# Patient Record
Sex: Male | Born: 1940 | Race: Black or African American | Hispanic: No | Marital: Married | Smoking: Never smoker
Health system: Southern US, Community
[De-identification: ages and names within clinical notes are randomized; demographics above are authoritative.]

## PROBLEM LIST (undated history)

## (undated) DIAGNOSIS — E119 Type 2 diabetes mellitus without complications: Secondary | ICD-10-CM

## (undated) DIAGNOSIS — I10 Essential (primary) hypertension: Secondary | ICD-10-CM

## (undated) DIAGNOSIS — M1990 Unspecified osteoarthritis, unspecified site: Secondary | ICD-10-CM

## (undated) HISTORY — DX: Unspecified osteoarthritis, unspecified site: M19.90

## (undated) HISTORY — PX: HERNIA REPAIR: SHX51

## (undated) HISTORY — PX: KNEE SURGERY: SHX244

## (undated) HISTORY — DX: Type 2 diabetes mellitus without complications: E11.9

## (undated) HISTORY — DX: Essential (primary) hypertension: I10

---

## 2014-08-15 DIAGNOSIS — E1142 Type 2 diabetes mellitus with diabetic polyneuropathy: Secondary | ICD-10-CM | POA: Diagnosis not present

## 2014-08-15 DIAGNOSIS — I10 Essential (primary) hypertension: Secondary | ICD-10-CM | POA: Diagnosis not present

## 2014-08-15 DIAGNOSIS — M1990 Unspecified osteoarthritis, unspecified site: Secondary | ICD-10-CM | POA: Diagnosis not present

## 2014-08-15 DIAGNOSIS — M109 Gout, unspecified: Secondary | ICD-10-CM | POA: Diagnosis not present

## 2014-12-17 DIAGNOSIS — I10 Essential (primary) hypertension: Secondary | ICD-10-CM | POA: Diagnosis not present

## 2014-12-17 DIAGNOSIS — M109 Gout, unspecified: Secondary | ICD-10-CM | POA: Diagnosis not present

## 2014-12-17 DIAGNOSIS — G609 Hereditary and idiopathic neuropathy, unspecified: Secondary | ICD-10-CM | POA: Diagnosis not present

## 2014-12-17 DIAGNOSIS — M1990 Unspecified osteoarthritis, unspecified site: Secondary | ICD-10-CM | POA: Diagnosis not present

## 2014-12-17 DIAGNOSIS — E1165 Type 2 diabetes mellitus with hyperglycemia: Secondary | ICD-10-CM | POA: Diagnosis not present

## 2014-12-17 DIAGNOSIS — E1142 Type 2 diabetes mellitus with diabetic polyneuropathy: Secondary | ICD-10-CM | POA: Diagnosis not present

## 2014-12-17 DIAGNOSIS — Z23 Encounter for immunization: Secondary | ICD-10-CM | POA: Diagnosis not present

## 2015-01-01 DIAGNOSIS — M25522 Pain in left elbow: Secondary | ICD-10-CM | POA: Diagnosis not present

## 2015-01-01 DIAGNOSIS — M25422 Effusion, left elbow: Secondary | ICD-10-CM | POA: Diagnosis not present

## 2015-06-30 DIAGNOSIS — E11319 Type 2 diabetes mellitus with unspecified diabetic retinopathy without macular edema: Secondary | ICD-10-CM | POA: Diagnosis not present

## 2015-06-30 DIAGNOSIS — H5210 Myopia, unspecified eye: Secondary | ICD-10-CM | POA: Diagnosis not present

## 2015-06-30 DIAGNOSIS — H524 Presbyopia: Secondary | ICD-10-CM | POA: Diagnosis not present

## 2015-07-10 DIAGNOSIS — M109 Gout, unspecified: Secondary | ICD-10-CM | POA: Diagnosis not present

## 2015-07-10 DIAGNOSIS — G609 Hereditary and idiopathic neuropathy, unspecified: Secondary | ICD-10-CM | POA: Diagnosis not present

## 2015-07-10 DIAGNOSIS — E1142 Type 2 diabetes mellitus with diabetic polyneuropathy: Secondary | ICD-10-CM | POA: Diagnosis not present

## 2015-07-10 DIAGNOSIS — M170 Bilateral primary osteoarthritis of knee: Secondary | ICD-10-CM | POA: Diagnosis not present

## 2015-07-10 DIAGNOSIS — M1990 Unspecified osteoarthritis, unspecified site: Secondary | ICD-10-CM | POA: Diagnosis not present

## 2015-07-10 DIAGNOSIS — E1165 Type 2 diabetes mellitus with hyperglycemia: Secondary | ICD-10-CM | POA: Diagnosis not present

## 2015-07-10 DIAGNOSIS — I10 Essential (primary) hypertension: Secondary | ICD-10-CM | POA: Diagnosis not present

## 2015-07-11 DIAGNOSIS — E1165 Type 2 diabetes mellitus with hyperglycemia: Secondary | ICD-10-CM | POA: Diagnosis not present

## 2015-07-29 DIAGNOSIS — M179 Osteoarthritis of knee, unspecified: Secondary | ICD-10-CM | POA: Diagnosis not present

## 2015-08-15 DIAGNOSIS — I200 Unstable angina: Secondary | ICD-10-CM | POA: Diagnosis not present

## 2015-08-15 DIAGNOSIS — G20 Parkinson's disease: Secondary | ICD-10-CM | POA: Diagnosis not present

## 2015-08-15 DIAGNOSIS — R0789 Other chest pain: Secondary | ICD-10-CM | POA: Diagnosis not present

## 2015-08-15 DIAGNOSIS — Z794 Long term (current) use of insulin: Secondary | ICD-10-CM | POA: Diagnosis not present

## 2015-08-15 DIAGNOSIS — R258 Other abnormal involuntary movements: Secondary | ICD-10-CM | POA: Diagnosis not present

## 2015-08-15 DIAGNOSIS — Z7984 Long term (current) use of oral hypoglycemic drugs: Secondary | ICD-10-CM | POA: Diagnosis not present

## 2015-08-15 DIAGNOSIS — R079 Chest pain, unspecified: Secondary | ICD-10-CM | POA: Diagnosis not present

## 2015-08-15 DIAGNOSIS — E11649 Type 2 diabetes mellitus with hypoglycemia without coma: Secondary | ICD-10-CM | POA: Diagnosis not present

## 2015-08-15 DIAGNOSIS — I10 Essential (primary) hypertension: Secondary | ICD-10-CM | POA: Diagnosis not present

## 2015-08-16 DIAGNOSIS — I200 Unstable angina: Secondary | ICD-10-CM | POA: Diagnosis not present

## 2015-08-22 DIAGNOSIS — R000 Tachycardia, unspecified: Secondary | ICD-10-CM | POA: Diagnosis not present

## 2015-08-22 DIAGNOSIS — I491 Atrial premature depolarization: Secondary | ICD-10-CM | POA: Diagnosis not present

## 2015-08-22 DIAGNOSIS — I493 Ventricular premature depolarization: Secondary | ICD-10-CM | POA: Diagnosis not present

## 2015-08-22 DIAGNOSIS — R079 Chest pain, unspecified: Secondary | ICD-10-CM | POA: Diagnosis not present

## 2015-09-08 DIAGNOSIS — M1711 Unilateral primary osteoarthritis, right knee: Secondary | ICD-10-CM | POA: Diagnosis not present

## 2015-11-06 DIAGNOSIS — E1142 Type 2 diabetes mellitus with diabetic polyneuropathy: Secondary | ICD-10-CM | POA: Diagnosis not present

## 2015-11-06 DIAGNOSIS — I209 Angina pectoris, unspecified: Secondary | ICD-10-CM | POA: Diagnosis not present

## 2015-11-06 DIAGNOSIS — M1990 Unspecified osteoarthritis, unspecified site: Secondary | ICD-10-CM | POA: Diagnosis not present

## 2015-11-06 DIAGNOSIS — I10 Essential (primary) hypertension: Secondary | ICD-10-CM | POA: Diagnosis not present

## 2015-11-10 ENCOUNTER — Encounter: Payer: Self-pay | Admitting: *Deleted

## 2015-11-11 ENCOUNTER — Ambulatory Visit (INDEPENDENT_AMBULATORY_CARE_PROVIDER_SITE_OTHER): Payer: Commercial Managed Care - HMO | Admitting: Cardiovascular Disease

## 2015-11-11 ENCOUNTER — Encounter: Payer: Self-pay | Admitting: Cardiovascular Disease

## 2015-11-11 VITALS — BP 136/77 | HR 81 | Ht 73.0 in | Wt 191.0 lb

## 2015-11-11 DIAGNOSIS — I10 Essential (primary) hypertension: Secondary | ICD-10-CM | POA: Diagnosis not present

## 2015-11-11 DIAGNOSIS — Z87898 Personal history of other specified conditions: Secondary | ICD-10-CM

## 2015-11-11 DIAGNOSIS — R079 Chest pain, unspecified: Secondary | ICD-10-CM

## 2015-11-11 DIAGNOSIS — Z9289 Personal history of other medical treatment: Secondary | ICD-10-CM

## 2015-11-11 NOTE — Progress Notes (Signed)
Patient ID: Vincent Mccoy, male   DOB: 12-17-1940, 75 y.o.   MRN: [id number]       CARDIOLOGY CONSULT NOTE  Patient ID: Vincent Mccoy MRN: [id number] DOB/AGE: 12-17-1940 75 y.o.  Admit date: (Not on file) Primary Physician: Avon GullyFANTA,TESFAYE, MD Referring Physician:  ECG showed normal sinus rhythm with no ischemic ST segment or T-wave abnormalities, nor any arrhythmias. Reason for Consultation: chest pain  HPI: The patient is a 75 year old male with a history of hypertension and insulin-dependent diabetes mellitus who is referred for the evaluation of chest pain. He was reportedly hospitalized at [facility] for chest pain and ruled out for an acute coronary syndrome. I reviewed all relevant labs and studies and documentation pertaining to this hospitalization. He was hospitalized in April 2017. His symptoms were considered somewhat atypical for cardiac disease. There was some thought that it could've been esophageal. Troponins were normal. Mildly anemic hemoglobin 11.4. He underwent stress testing on 08/22/15 which was negative for ischemia. This appeared to be an exercise treadmill stress test. Chest x-ray showed no acute cardiopulmonary disease on 3/31.  The patient denies any symptoms of chest pain, palpitations, shortness of breath, lightheadedness, dizziness, leg swelling, orthopnea, PND, and syncope.  He walks on a treadmill and exercise on a recumbent bicycle 3 times per week without any physical limitations.    No Known Allergies  Current Outpatient Prescriptions  Medication Sig Dispense Refill  . allopurinol (ZYLOPRIM) 300 MG tablet Take 1 tablet by mouth daily.    Marland Kitchen. amLODipine (NORVASC) 10 MG tablet Take 10 mg by mouth daily.    Marland Kitchen. aspirin EC 81 MG tablet Take 81 mg by mouth daily.    Marland Kitchen. glipiZIDE (GLUCOTROL XL) 10 MG 24 hr tablet Take 10 mg by mouth daily with breakfast.    . Insulin Glargine (LANTUS SOLOSTAR) 100 UNIT/ML Solostar Pen Inject 40 Units into the skin daily at 10 pm.      . losartan-hydrochlorothiazide (HYZAAR) 100-12.5 MG tablet Take 1 tablet by mouth daily.    . traMADol (ULTRAM) 50 MG tablet Take 50 mg by mouth 2 (two) times daily.     No current facility-administered medications for this visit.    Past Medical History  Diagnosis Date  . Diabetes mellitus (HCC)   . Hypertension   . Osteoarthritis     Past Surgical History  Procedure Laterality Date  . Hernia repair    . Knee surgery      CARTILAGE REPAIR    Social History   Social History  . Marital Status: Married    Spouse Name: N/A  . Number of Children: N/A  . Years of Education: N/A   Occupational History  . Not on file.   Social History Main Topics  . Smoking status: Never Smoker   . Smokeless tobacco: Never Used  . Alcohol Use: Not on file  . Drug Use: Not on file  . Sexual Activity: Not on file   Other Topics Concern  . Not on file   Social History Narrative     No family history of premature CAD in 1st degree relatives.  Prior to Admission medications   Medication Sig Start Date End Date Taking? Authorizing Provider  allopurinol (ZYLOPRIM) 300 MG tablet Take 1 tablet by mouth daily. 09/25/15  Yes Historical Provider, MD  amLODipine (NORVASC) 10 MG tablet Take 10 mg by mouth daily.   Yes Historical Provider, MD  aspirin EC 81 MG tablet Take 81 mg by mouth daily.  Yes Historical Provider, MD  glipiZIDE (GLUCOTROL XL) 10 MG 24 hr tablet Take 10 mg by mouth daily with breakfast.   Yes Historical Provider, MD  Insulin Glargine (LANTUS SOLOSTAR) 100 UNIT/ML Solostar Pen Inject 40 Units into the skin daily at 10 pm.    Yes Historical Provider, MD  losartan-hydrochlorothiazide (HYZAAR) 100-12.5 MG tablet Take 1 tablet by mouth daily.   Yes Historical Provider, MD  traMADol (ULTRAM) 50 MG tablet Take 50 mg by mouth 2 (two) times daily.   Yes Historical Provider, MD     Review of systems complete and found to be negative unless listed above in HPI     Physical  exam Blood pressure 136/77, pulse 81, height 6\' 1"  (1.854 m), weight 191 lb (86.637 kg). General: NAD Neck: No JVD, no thyromegaly or thyroid nodule.  Lungs: Clear to auscultation bilaterally with normal respiratory effort. CV: Nondisplaced PMI. Regular rate and rhythm, normal S1/S2, no S3/S4, no murmur.  No peripheral edema.  No carotid bruit.    Abdomen: Soft, nontender, no distention.  Skin: Intact without lesions or rashes.  Neurologic: Alert and oriented x 3.  Psych: Normal affect. Extremities: No clubbing or cyanosis.  HEENT: Normal.   ECG: Most recent ECG reviewed.  Labs:  No results found for: WBC, HGB, HCT, MCV, PLT No results for input(s): NA, K, CL, CO2, BUN, CREATININE, CALCIUM, PROT, BILITOT, ALKPHOS, ALT, AST, GLUCOSE in the last 168 hours.  Invalid input(s): LABALBU No results found for: CKTOTAL, CKMB, CKMBINDEX, TROPONINI No results found for: CHOL No results found for: HDL No results found for: LDLCALC No results found for: TRIG No results found for: CHOLHDL No results found for: LDLDIRECT       Studies: No results found.  ASSESSMENT AND PLAN:  1. Chest pain: No recurrences. Normal GXT. Exercises routinely without symptoms. No further testing indicated.  2. Essential HTN: Controlled. No changes.  Dispo: fu prn.   Signed: Prentice DockerSuresh Avonelle Viveros, M.D., F.A.C.C.  11/11/2015, 9:35 AM

## 2015-11-11 NOTE — Patient Instructions (Signed)
Your physician recommends that you schedule a follow-up appointment AS NEEDED WITH DR. KONESWARAN  Your physician recommends that you continue on your current medications as directed. Please refer to the Current Medication list given to you today.  Thank you for choosing [facility]!!   

## 2016-02-13 DIAGNOSIS — E1165 Type 2 diabetes mellitus with hyperglycemia: Secondary | ICD-10-CM | POA: Diagnosis not present

## 2016-02-13 DIAGNOSIS — M109 Gout, unspecified: Secondary | ICD-10-CM | POA: Diagnosis not present

## 2016-02-13 DIAGNOSIS — E1142 Type 2 diabetes mellitus with diabetic polyneuropathy: Secondary | ICD-10-CM | POA: Diagnosis not present

## 2016-02-13 DIAGNOSIS — Z0000 Encounter for general adult medical examination without abnormal findings: Secondary | ICD-10-CM | POA: Diagnosis not present

## 2016-02-13 DIAGNOSIS — M1990 Unspecified osteoarthritis, unspecified site: Secondary | ICD-10-CM | POA: Diagnosis not present

## 2016-02-13 DIAGNOSIS — G609 Hereditary and idiopathic neuropathy, unspecified: Secondary | ICD-10-CM | POA: Diagnosis not present

## 2016-02-13 DIAGNOSIS — I10 Essential (primary) hypertension: Secondary | ICD-10-CM | POA: Diagnosis not present

## 2016-02-17 DIAGNOSIS — E119 Type 2 diabetes mellitus without complications: Secondary | ICD-10-CM | POA: Diagnosis not present

## 2016-02-23 DIAGNOSIS — G20 Parkinson's disease: Secondary | ICD-10-CM | POA: Diagnosis not present

## 2016-02-23 DIAGNOSIS — I10 Essential (primary) hypertension: Secondary | ICD-10-CM | POA: Diagnosis not present

## 2016-02-23 DIAGNOSIS — Z79899 Other long term (current) drug therapy: Secondary | ICD-10-CM | POA: Diagnosis not present

## 2016-02-23 DIAGNOSIS — Z7982 Long term (current) use of aspirin: Secondary | ICD-10-CM | POA: Diagnosis not present

## 2016-02-23 DIAGNOSIS — M76892 Other specified enthesopathies of left lower limb, excluding foot: Secondary | ICD-10-CM | POA: Diagnosis not present

## 2016-02-23 DIAGNOSIS — Z794 Long term (current) use of insulin: Secondary | ICD-10-CM | POA: Diagnosis not present

## 2016-02-23 DIAGNOSIS — E119 Type 2 diabetes mellitus without complications: Secondary | ICD-10-CM | POA: Diagnosis not present

## 2016-02-23 DIAGNOSIS — M79662 Pain in left lower leg: Secondary | ICD-10-CM | POA: Diagnosis not present

## 2016-03-04 DIAGNOSIS — M79605 Pain in left leg: Secondary | ICD-10-CM | POA: Diagnosis not present

## 2016-03-26 DIAGNOSIS — M545 Low back pain: Secondary | ICD-10-CM | POA: Diagnosis not present

## 2016-03-26 DIAGNOSIS — M5432 Sciatica, left side: Secondary | ICD-10-CM | POA: Diagnosis not present

## 2016-04-06 DIAGNOSIS — M5432 Sciatica, left side: Secondary | ICD-10-CM | POA: Diagnosis not present

## 2016-04-06 DIAGNOSIS — R937 Abnormal findings on diagnostic imaging of other parts of musculoskeletal system: Secondary | ICD-10-CM | POA: Diagnosis not present

## 2016-04-06 DIAGNOSIS — M48061 Spinal stenosis, lumbar region without neurogenic claudication: Secondary | ICD-10-CM | POA: Diagnosis not present

## 2016-04-06 DIAGNOSIS — M545 Low back pain: Secondary | ICD-10-CM | POA: Diagnosis not present

## 2016-04-06 DIAGNOSIS — M9943 Connective tissue stenosis of neural canal of lumbar region: Secondary | ICD-10-CM | POA: Diagnosis not present

## 2016-04-06 DIAGNOSIS — M5126 Other intervertebral disc displacement, lumbar region: Secondary | ICD-10-CM | POA: Diagnosis not present

## 2016-04-12 DIAGNOSIS — M5116 Intervertebral disc disorders with radiculopathy, lumbar region: Secondary | ICD-10-CM | POA: Diagnosis not present

## 2016-04-12 DIAGNOSIS — M5136 Other intervertebral disc degeneration, lumbar region: Secondary | ICD-10-CM | POA: Diagnosis not present

## 2016-04-12 DIAGNOSIS — M48061 Spinal stenosis, lumbar region without neurogenic claudication: Secondary | ICD-10-CM | POA: Diagnosis not present

## 2016-05-12 DIAGNOSIS — I10 Essential (primary) hypertension: Secondary | ICD-10-CM | POA: Diagnosis not present

## 2016-05-27 DIAGNOSIS — Z79899 Other long term (current) drug therapy: Secondary | ICD-10-CM | POA: Diagnosis not present

## 2016-05-27 DIAGNOSIS — I10 Essential (primary) hypertension: Secondary | ICD-10-CM | POA: Diagnosis not present

## 2016-05-27 DIAGNOSIS — Z794 Long term (current) use of insulin: Secondary | ICD-10-CM | POA: Diagnosis not present

## 2016-05-27 DIAGNOSIS — M810 Age-related osteoporosis without current pathological fracture: Secondary | ICD-10-CM | POA: Diagnosis not present

## 2016-05-27 DIAGNOSIS — E119 Type 2 diabetes mellitus without complications: Secondary | ICD-10-CM | POA: Diagnosis not present

## 2016-05-27 DIAGNOSIS — M109 Gout, unspecified: Secondary | ICD-10-CM | POA: Diagnosis not present

## 2016-05-27 DIAGNOSIS — Z7982 Long term (current) use of aspirin: Secondary | ICD-10-CM | POA: Diagnosis not present

## 2016-06-03 DIAGNOSIS — M48061 Spinal stenosis, lumbar region without neurogenic claudication: Secondary | ICD-10-CM | POA: Diagnosis not present

## 2016-06-03 DIAGNOSIS — M5126 Other intervertebral disc displacement, lumbar region: Secondary | ICD-10-CM | POA: Diagnosis not present

## 2016-06-21 DIAGNOSIS — Z538 Procedure and treatment not carried out for other reasons: Secondary | ICD-10-CM | POA: Diagnosis not present

## 2016-06-21 DIAGNOSIS — R7989 Other specified abnormal findings of blood chemistry: Secondary | ICD-10-CM | POA: Diagnosis not present

## 2016-06-21 DIAGNOSIS — M5126 Other intervertebral disc displacement, lumbar region: Secondary | ICD-10-CM | POA: Diagnosis not present

## 2016-06-24 DIAGNOSIS — R7989 Other specified abnormal findings of blood chemistry: Secondary | ICD-10-CM | POA: Diagnosis not present

## 2016-06-24 DIAGNOSIS — Z538 Procedure and treatment not carried out for other reasons: Secondary | ICD-10-CM | POA: Diagnosis not present

## 2016-06-24 DIAGNOSIS — M5126 Other intervertebral disc displacement, lumbar region: Secondary | ICD-10-CM | POA: Diagnosis not present

## 2016-06-28 DIAGNOSIS — E119 Type 2 diabetes mellitus without complications: Secondary | ICD-10-CM | POA: Diagnosis not present

## 2016-06-28 DIAGNOSIS — M5106 Intervertebral disc disorders with myelopathy, lumbar region: Secondary | ICD-10-CM | POA: Diagnosis not present

## 2016-06-28 DIAGNOSIS — M5126 Other intervertebral disc displacement, lumbar region: Secondary | ICD-10-CM | POA: Diagnosis not present

## 2016-06-28 DIAGNOSIS — I10 Essential (primary) hypertension: Secondary | ICD-10-CM | POA: Diagnosis not present

## 2016-06-28 DIAGNOSIS — Z981 Arthrodesis status: Secondary | ICD-10-CM | POA: Diagnosis not present

## 2016-06-29 DIAGNOSIS — M5126 Other intervertebral disc displacement, lumbar region: Secondary | ICD-10-CM | POA: Diagnosis not present

## 2016-06-30 DIAGNOSIS — M5126 Other intervertebral disc displacement, lumbar region: Secondary | ICD-10-CM | POA: Diagnosis not present

## 2016-07-01 DIAGNOSIS — I10 Essential (primary) hypertension: Secondary | ICD-10-CM | POA: Diagnosis not present

## 2016-07-01 DIAGNOSIS — Z4789 Encounter for other orthopedic aftercare: Secondary | ICD-10-CM | POA: Diagnosis not present

## 2016-07-01 DIAGNOSIS — M4326 Fusion of spine, lumbar region: Secondary | ICD-10-CM | POA: Diagnosis not present

## 2016-07-01 DIAGNOSIS — M6281 Muscle weakness (generalized): Secondary | ICD-10-CM | POA: Diagnosis not present

## 2016-07-01 DIAGNOSIS — Z7982 Long term (current) use of aspirin: Secondary | ICD-10-CM | POA: Diagnosis not present

## 2016-07-01 DIAGNOSIS — E119 Type 2 diabetes mellitus without complications: Secondary | ICD-10-CM | POA: Diagnosis not present

## 2016-07-01 DIAGNOSIS — Z794 Long term (current) use of insulin: Secondary | ICD-10-CM | POA: Diagnosis not present

## 2016-07-02 DIAGNOSIS — M5126 Other intervertebral disc displacement, lumbar region: Secondary | ICD-10-CM | POA: Diagnosis not present

## 2016-07-02 DIAGNOSIS — M48061 Spinal stenosis, lumbar region without neurogenic claudication: Secondary | ICD-10-CM | POA: Diagnosis not present

## 2016-07-07 DIAGNOSIS — M6281 Muscle weakness (generalized): Secondary | ICD-10-CM | POA: Diagnosis not present

## 2016-07-07 DIAGNOSIS — Z4789 Encounter for other orthopedic aftercare: Secondary | ICD-10-CM | POA: Diagnosis not present

## 2016-07-07 DIAGNOSIS — Z7982 Long term (current) use of aspirin: Secondary | ICD-10-CM | POA: Diagnosis not present

## 2016-07-07 DIAGNOSIS — E119 Type 2 diabetes mellitus without complications: Secondary | ICD-10-CM | POA: Diagnosis not present

## 2016-07-07 DIAGNOSIS — M4326 Fusion of spine, lumbar region: Secondary | ICD-10-CM | POA: Diagnosis not present

## 2016-07-07 DIAGNOSIS — I10 Essential (primary) hypertension: Secondary | ICD-10-CM | POA: Diagnosis not present

## 2016-07-07 DIAGNOSIS — Z794 Long term (current) use of insulin: Secondary | ICD-10-CM | POA: Diagnosis not present

## 2016-07-09 DIAGNOSIS — M4326 Fusion of spine, lumbar region: Secondary | ICD-10-CM | POA: Diagnosis not present

## 2016-07-09 DIAGNOSIS — I10 Essential (primary) hypertension: Secondary | ICD-10-CM | POA: Diagnosis not present

## 2016-07-09 DIAGNOSIS — Z7982 Long term (current) use of aspirin: Secondary | ICD-10-CM | POA: Diagnosis not present

## 2016-07-09 DIAGNOSIS — Z4789 Encounter for other orthopedic aftercare: Secondary | ICD-10-CM | POA: Diagnosis not present

## 2016-07-09 DIAGNOSIS — Z794 Long term (current) use of insulin: Secondary | ICD-10-CM | POA: Diagnosis not present

## 2016-07-09 DIAGNOSIS — M6281 Muscle weakness (generalized): Secondary | ICD-10-CM | POA: Diagnosis not present

## 2016-07-09 DIAGNOSIS — E119 Type 2 diabetes mellitus without complications: Secondary | ICD-10-CM | POA: Diagnosis not present

## 2016-07-15 DIAGNOSIS — E119 Type 2 diabetes mellitus without complications: Secondary | ICD-10-CM | POA: Diagnosis not present

## 2016-07-15 DIAGNOSIS — M6281 Muscle weakness (generalized): Secondary | ICD-10-CM | POA: Diagnosis not present

## 2016-07-15 DIAGNOSIS — Z7982 Long term (current) use of aspirin: Secondary | ICD-10-CM | POA: Diagnosis not present

## 2016-07-15 DIAGNOSIS — M4326 Fusion of spine, lumbar region: Secondary | ICD-10-CM | POA: Diagnosis not present

## 2016-07-15 DIAGNOSIS — Z794 Long term (current) use of insulin: Secondary | ICD-10-CM | POA: Diagnosis not present

## 2016-07-15 DIAGNOSIS — I10 Essential (primary) hypertension: Secondary | ICD-10-CM | POA: Diagnosis not present

## 2016-07-15 DIAGNOSIS — Z4789 Encounter for other orthopedic aftercare: Secondary | ICD-10-CM | POA: Diagnosis not present

## 2016-07-16 DIAGNOSIS — E119 Type 2 diabetes mellitus without complications: Secondary | ICD-10-CM | POA: Diagnosis not present

## 2016-07-16 DIAGNOSIS — I10 Essential (primary) hypertension: Secondary | ICD-10-CM | POA: Diagnosis not present

## 2016-07-16 DIAGNOSIS — M4326 Fusion of spine, lumbar region: Secondary | ICD-10-CM | POA: Diagnosis not present

## 2016-07-16 DIAGNOSIS — M6281 Muscle weakness (generalized): Secondary | ICD-10-CM | POA: Diagnosis not present

## 2016-07-16 DIAGNOSIS — Z7982 Long term (current) use of aspirin: Secondary | ICD-10-CM | POA: Diagnosis not present

## 2016-07-16 DIAGNOSIS — Z4789 Encounter for other orthopedic aftercare: Secondary | ICD-10-CM | POA: Diagnosis not present

## 2016-07-16 DIAGNOSIS — Z794 Long term (current) use of insulin: Secondary | ICD-10-CM | POA: Diagnosis not present

## 2016-07-19 DIAGNOSIS — M1990 Unspecified osteoarthritis, unspecified site: Secondary | ICD-10-CM | POA: Diagnosis not present

## 2016-07-19 DIAGNOSIS — M109 Gout, unspecified: Secondary | ICD-10-CM | POA: Diagnosis not present

## 2016-07-19 DIAGNOSIS — M961 Postlaminectomy syndrome, not elsewhere classified: Secondary | ICD-10-CM | POA: Diagnosis not present

## 2016-07-19 DIAGNOSIS — E119 Type 2 diabetes mellitus without complications: Secondary | ICD-10-CM | POA: Diagnosis not present

## 2016-07-19 DIAGNOSIS — E1142 Type 2 diabetes mellitus with diabetic polyneuropathy: Secondary | ICD-10-CM | POA: Diagnosis not present

## 2016-07-19 DIAGNOSIS — E1165 Type 2 diabetes mellitus with hyperglycemia: Secondary | ICD-10-CM | POA: Diagnosis not present

## 2016-07-19 DIAGNOSIS — G609 Hereditary and idiopathic neuropathy, unspecified: Secondary | ICD-10-CM | POA: Diagnosis not present

## 2016-07-19 DIAGNOSIS — I10 Essential (primary) hypertension: Secondary | ICD-10-CM | POA: Diagnosis not present

## 2016-07-19 DIAGNOSIS — Z0000 Encounter for general adult medical examination without abnormal findings: Secondary | ICD-10-CM | POA: Diagnosis not present

## 2016-07-21 DIAGNOSIS — Z4789 Encounter for other orthopedic aftercare: Secondary | ICD-10-CM | POA: Diagnosis not present

## 2016-07-21 DIAGNOSIS — E119 Type 2 diabetes mellitus without complications: Secondary | ICD-10-CM | POA: Diagnosis not present

## 2016-07-21 DIAGNOSIS — M4326 Fusion of spine, lumbar region: Secondary | ICD-10-CM | POA: Diagnosis not present

## 2016-07-21 DIAGNOSIS — M6281 Muscle weakness (generalized): Secondary | ICD-10-CM | POA: Diagnosis not present

## 2016-07-21 DIAGNOSIS — Z794 Long term (current) use of insulin: Secondary | ICD-10-CM | POA: Diagnosis not present

## 2016-07-21 DIAGNOSIS — Z7982 Long term (current) use of aspirin: Secondary | ICD-10-CM | POA: Diagnosis not present

## 2016-07-21 DIAGNOSIS — I10 Essential (primary) hypertension: Secondary | ICD-10-CM | POA: Diagnosis not present

## 2016-07-23 DIAGNOSIS — Z794 Long term (current) use of insulin: Secondary | ICD-10-CM | POA: Diagnosis not present

## 2016-07-23 DIAGNOSIS — E119 Type 2 diabetes mellitus without complications: Secondary | ICD-10-CM | POA: Diagnosis not present

## 2016-07-23 DIAGNOSIS — M4326 Fusion of spine, lumbar region: Secondary | ICD-10-CM | POA: Diagnosis not present

## 2016-07-23 DIAGNOSIS — I10 Essential (primary) hypertension: Secondary | ICD-10-CM | POA: Diagnosis not present

## 2016-07-23 DIAGNOSIS — M6281 Muscle weakness (generalized): Secondary | ICD-10-CM | POA: Diagnosis not present

## 2016-07-23 DIAGNOSIS — Z7982 Long term (current) use of aspirin: Secondary | ICD-10-CM | POA: Diagnosis not present

## 2016-07-23 DIAGNOSIS — Z4789 Encounter for other orthopedic aftercare: Secondary | ICD-10-CM | POA: Diagnosis not present

## 2016-07-27 DIAGNOSIS — Z7982 Long term (current) use of aspirin: Secondary | ICD-10-CM | POA: Diagnosis not present

## 2016-07-27 DIAGNOSIS — M4326 Fusion of spine, lumbar region: Secondary | ICD-10-CM | POA: Diagnosis not present

## 2016-07-27 DIAGNOSIS — M6281 Muscle weakness (generalized): Secondary | ICD-10-CM | POA: Diagnosis not present

## 2016-07-27 DIAGNOSIS — I10 Essential (primary) hypertension: Secondary | ICD-10-CM | POA: Diagnosis not present

## 2016-07-27 DIAGNOSIS — E119 Type 2 diabetes mellitus without complications: Secondary | ICD-10-CM | POA: Diagnosis not present

## 2016-07-27 DIAGNOSIS — Z794 Long term (current) use of insulin: Secondary | ICD-10-CM | POA: Diagnosis not present

## 2016-07-27 DIAGNOSIS — Z4789 Encounter for other orthopedic aftercare: Secondary | ICD-10-CM | POA: Diagnosis not present

## 2016-07-28 DIAGNOSIS — Z4789 Encounter for other orthopedic aftercare: Secondary | ICD-10-CM | POA: Diagnosis not present

## 2016-07-28 DIAGNOSIS — M6281 Muscle weakness (generalized): Secondary | ICD-10-CM | POA: Diagnosis not present

## 2016-07-28 DIAGNOSIS — E119 Type 2 diabetes mellitus without complications: Secondary | ICD-10-CM | POA: Diagnosis not present

## 2016-07-28 DIAGNOSIS — Z7982 Long term (current) use of aspirin: Secondary | ICD-10-CM | POA: Diagnosis not present

## 2016-07-28 DIAGNOSIS — Z794 Long term (current) use of insulin: Secondary | ICD-10-CM | POA: Diagnosis not present

## 2016-07-28 DIAGNOSIS — I10 Essential (primary) hypertension: Secondary | ICD-10-CM | POA: Diagnosis not present

## 2016-07-28 DIAGNOSIS — M4326 Fusion of spine, lumbar region: Secondary | ICD-10-CM | POA: Diagnosis not present

## 2016-08-03 DIAGNOSIS — M4326 Fusion of spine, lumbar region: Secondary | ICD-10-CM | POA: Diagnosis not present

## 2016-08-03 DIAGNOSIS — E119 Type 2 diabetes mellitus without complications: Secondary | ICD-10-CM | POA: Diagnosis not present

## 2016-08-03 DIAGNOSIS — Z794 Long term (current) use of insulin: Secondary | ICD-10-CM | POA: Diagnosis not present

## 2016-08-03 DIAGNOSIS — M6281 Muscle weakness (generalized): Secondary | ICD-10-CM | POA: Diagnosis not present

## 2016-08-03 DIAGNOSIS — I10 Essential (primary) hypertension: Secondary | ICD-10-CM | POA: Diagnosis not present

## 2016-08-03 DIAGNOSIS — Z4789 Encounter for other orthopedic aftercare: Secondary | ICD-10-CM | POA: Diagnosis not present

## 2016-08-03 DIAGNOSIS — Z7982 Long term (current) use of aspirin: Secondary | ICD-10-CM | POA: Diagnosis not present

## 2016-08-06 DIAGNOSIS — I10 Essential (primary) hypertension: Secondary | ICD-10-CM | POA: Diagnosis not present

## 2016-08-06 DIAGNOSIS — M6281 Muscle weakness (generalized): Secondary | ICD-10-CM | POA: Diagnosis not present

## 2016-08-06 DIAGNOSIS — Z794 Long term (current) use of insulin: Secondary | ICD-10-CM | POA: Diagnosis not present

## 2016-08-06 DIAGNOSIS — E119 Type 2 diabetes mellitus without complications: Secondary | ICD-10-CM | POA: Diagnosis not present

## 2016-08-06 DIAGNOSIS — Z4789 Encounter for other orthopedic aftercare: Secondary | ICD-10-CM | POA: Diagnosis not present

## 2016-08-06 DIAGNOSIS — M4326 Fusion of spine, lumbar region: Secondary | ICD-10-CM | POA: Diagnosis not present

## 2016-08-06 DIAGNOSIS — Z7982 Long term (current) use of aspirin: Secondary | ICD-10-CM | POA: Diagnosis not present

## 2016-08-18 ENCOUNTER — Telehealth: Payer: Self-pay

## 2016-08-18 NOTE — Telephone Encounter (Signed)
Noted  

## 2016-08-18 NOTE — Telephone Encounter (Signed)
Pt called- he said we have been trying to get in touch with him to schedule tcs. He said he spoke with his doctor and was told to wait on the tcs d/t his recent back surgery. Pt stated he would call us back when his doctor told him it was ok to have it done.   Routing to [employer] as FYI.

## 2016-08-30 DIAGNOSIS — M179 Osteoarthritis of knee, unspecified: Secondary | ICD-10-CM | POA: Diagnosis not present

## 2016-09-28 DIAGNOSIS — M5432 Sciatica, left side: Secondary | ICD-10-CM | POA: Diagnosis not present

## 2016-09-28 DIAGNOSIS — M25561 Pain in right knee: Secondary | ICD-10-CM | POA: Diagnosis not present

## 2016-09-28 DIAGNOSIS — M1711 Unilateral primary osteoarthritis, right knee: Secondary | ICD-10-CM | POA: Diagnosis not present

## 2016-10-04 DIAGNOSIS — M810 Age-related osteoporosis without current pathological fracture: Secondary | ICD-10-CM | POA: Diagnosis not present

## 2016-10-19 DIAGNOSIS — I10 Essential (primary) hypertension: Secondary | ICD-10-CM | POA: Diagnosis not present

## 2016-10-19 DIAGNOSIS — N3941 Urge incontinence: Secondary | ICD-10-CM | POA: Diagnosis not present

## 2016-10-19 DIAGNOSIS — E1165 Type 2 diabetes mellitus with hyperglycemia: Secondary | ICD-10-CM | POA: Diagnosis not present

## 2016-10-19 DIAGNOSIS — M1990 Unspecified osteoarthritis, unspecified site: Secondary | ICD-10-CM | POA: Diagnosis not present

## 2016-11-03 DIAGNOSIS — M5126 Other intervertebral disc displacement, lumbar region: Secondary | ICD-10-CM | POA: Diagnosis not present

## 2017-03-02 DIAGNOSIS — Z0000 Encounter for general adult medical examination without abnormal findings: Secondary | ICD-10-CM | POA: Diagnosis not present

## 2017-03-02 DIAGNOSIS — G609 Hereditary and idiopathic neuropathy, unspecified: Secondary | ICD-10-CM | POA: Diagnosis not present

## 2017-03-02 DIAGNOSIS — E119 Type 2 diabetes mellitus without complications: Secondary | ICD-10-CM | POA: Diagnosis not present

## 2017-03-02 DIAGNOSIS — M1000 Idiopathic gout, unspecified site: Secondary | ICD-10-CM | POA: Diagnosis not present

## 2017-03-02 DIAGNOSIS — E1165 Type 2 diabetes mellitus with hyperglycemia: Secondary | ICD-10-CM | POA: Diagnosis not present

## 2017-03-02 DIAGNOSIS — E1142 Type 2 diabetes mellitus with diabetic polyneuropathy: Secondary | ICD-10-CM | POA: Diagnosis not present

## 2017-03-02 DIAGNOSIS — M109 Gout, unspecified: Secondary | ICD-10-CM | POA: Diagnosis not present

## 2017-03-02 DIAGNOSIS — Z1389 Encounter for screening for other disorder: Secondary | ICD-10-CM | POA: Diagnosis not present

## 2017-03-02 DIAGNOSIS — I10 Essential (primary) hypertension: Secondary | ICD-10-CM | POA: Diagnosis not present

## 2017-03-02 DIAGNOSIS — M1990 Unspecified osteoarthritis, unspecified site: Secondary | ICD-10-CM | POA: Diagnosis not present

## 2017-03-03 DIAGNOSIS — M109 Gout, unspecified: Secondary | ICD-10-CM | POA: Diagnosis not present

## 2017-03-03 DIAGNOSIS — E1165 Type 2 diabetes mellitus with hyperglycemia: Secondary | ICD-10-CM | POA: Diagnosis not present

## 2017-03-03 DIAGNOSIS — G609 Hereditary and idiopathic neuropathy, unspecified: Secondary | ICD-10-CM | POA: Diagnosis not present

## 2017-03-03 DIAGNOSIS — Z0000 Encounter for general adult medical examination without abnormal findings: Secondary | ICD-10-CM | POA: Diagnosis not present

## 2017-03-03 DIAGNOSIS — E1142 Type 2 diabetes mellitus with diabetic polyneuropathy: Secondary | ICD-10-CM | POA: Diagnosis not present

## 2017-03-03 DIAGNOSIS — M1990 Unspecified osteoarthritis, unspecified site: Secondary | ICD-10-CM | POA: Diagnosis not present

## 2017-03-03 DIAGNOSIS — I10 Essential (primary) hypertension: Secondary | ICD-10-CM | POA: Diagnosis not present

## 2017-03-03 DIAGNOSIS — E119 Type 2 diabetes mellitus without complications: Secondary | ICD-10-CM | POA: Diagnosis not present

## 2017-04-03 DIAGNOSIS — E119 Type 2 diabetes mellitus without complications: Secondary | ICD-10-CM | POA: Diagnosis not present

## 2017-04-03 DIAGNOSIS — Z794 Long term (current) use of insulin: Secondary | ICD-10-CM | POA: Diagnosis not present

## 2017-04-03 DIAGNOSIS — Z7982 Long term (current) use of aspirin: Secondary | ICD-10-CM | POA: Diagnosis not present

## 2017-04-03 DIAGNOSIS — E118 Type 2 diabetes mellitus with unspecified complications: Secondary | ICD-10-CM | POA: Diagnosis not present

## 2017-04-03 DIAGNOSIS — R55 Syncope and collapse: Secondary | ICD-10-CM | POA: Diagnosis not present

## 2017-04-03 DIAGNOSIS — I10 Essential (primary) hypertension: Secondary | ICD-10-CM | POA: Diagnosis not present

## 2017-04-03 DIAGNOSIS — E860 Dehydration: Secondary | ICD-10-CM | POA: Diagnosis not present

## 2017-04-03 DIAGNOSIS — G20 Parkinson's disease: Secondary | ICD-10-CM | POA: Diagnosis not present

## 2017-04-03 DIAGNOSIS — Z8639 Personal history of other endocrine, nutritional and metabolic disease: Secondary | ICD-10-CM | POA: Diagnosis not present

## 2017-04-03 DIAGNOSIS — M1990 Unspecified osteoarthritis, unspecified site: Secondary | ICD-10-CM | POA: Diagnosis not present

## 2017-04-03 DIAGNOSIS — R109 Unspecified abdominal pain: Secondary | ICD-10-CM | POA: Diagnosis not present

## 2017-04-04 DIAGNOSIS — E1165 Type 2 diabetes mellitus with hyperglycemia: Secondary | ICD-10-CM | POA: Diagnosis not present

## 2017-04-04 DIAGNOSIS — I10 Essential (primary) hypertension: Secondary | ICD-10-CM | POA: Diagnosis not present

## 2017-06-02 DIAGNOSIS — M1990 Unspecified osteoarthritis, unspecified site: Secondary | ICD-10-CM | POA: Diagnosis not present

## 2017-06-02 DIAGNOSIS — M1000 Idiopathic gout, unspecified site: Secondary | ICD-10-CM | POA: Diagnosis not present

## 2017-06-02 DIAGNOSIS — I10 Essential (primary) hypertension: Secondary | ICD-10-CM | POA: Diagnosis not present

## 2017-06-02 DIAGNOSIS — E1165 Type 2 diabetes mellitus with hyperglycemia: Secondary | ICD-10-CM | POA: Diagnosis not present

## 2017-06-16 DIAGNOSIS — M1711 Unilateral primary osteoarthritis, right knee: Secondary | ICD-10-CM | POA: Diagnosis not present

## 2017-06-16 DIAGNOSIS — M25561 Pain in right knee: Secondary | ICD-10-CM | POA: Insufficient documentation

## 2017-06-16 DIAGNOSIS — S72341P Displaced spiral fracture of shaft of right femur, subsequent encounter for closed fracture with malunion: Secondary | ICD-10-CM | POA: Diagnosis not present

## 2017-07-19 ENCOUNTER — Ambulatory Visit: Payer: Medicare HMO | Admitting: Urology

## 2017-07-19 DIAGNOSIS — R3915 Urgency of urination: Secondary | ICD-10-CM

## 2017-07-19 DIAGNOSIS — R351 Nocturia: Secondary | ICD-10-CM | POA: Diagnosis not present

## 2017-07-19 DIAGNOSIS — R350 Frequency of micturition: Secondary | ICD-10-CM | POA: Diagnosis not present

## 2017-07-19 DIAGNOSIS — Q539 Undescended testicle, unspecified: Secondary | ICD-10-CM

## 2017-09-01 DIAGNOSIS — E1165 Type 2 diabetes mellitus with hyperglycemia: Secondary | ICD-10-CM | POA: Diagnosis not present

## 2017-09-01 DIAGNOSIS — I10 Essential (primary) hypertension: Secondary | ICD-10-CM | POA: Diagnosis not present

## 2017-09-01 DIAGNOSIS — E119 Type 2 diabetes mellitus without complications: Secondary | ICD-10-CM | POA: Diagnosis not present

## 2017-09-01 DIAGNOSIS — J449 Chronic obstructive pulmonary disease, unspecified: Secondary | ICD-10-CM | POA: Diagnosis not present

## 2017-09-01 DIAGNOSIS — E785 Hyperlipidemia, unspecified: Secondary | ICD-10-CM | POA: Diagnosis not present

## 2017-09-01 DIAGNOSIS — R739 Hyperglycemia, unspecified: Secondary | ICD-10-CM | POA: Diagnosis not present

## 2017-09-01 DIAGNOSIS — E039 Hypothyroidism, unspecified: Secondary | ICD-10-CM | POA: Diagnosis not present

## 2017-09-01 DIAGNOSIS — M1000 Idiopathic gout, unspecified site: Secondary | ICD-10-CM | POA: Diagnosis not present

## 2017-09-01 DIAGNOSIS — E669 Obesity, unspecified: Secondary | ICD-10-CM | POA: Diagnosis not present

## 2017-11-11 DIAGNOSIS — H5213 Myopia, bilateral: Secondary | ICD-10-CM | POA: Diagnosis not present

## 2017-11-29 DIAGNOSIS — M1711 Unilateral primary osteoarthritis, right knee: Secondary | ICD-10-CM | POA: Diagnosis not present

## 2017-12-22 DIAGNOSIS — E1165 Type 2 diabetes mellitus with hyperglycemia: Secondary | ICD-10-CM | POA: Diagnosis not present

## 2017-12-22 DIAGNOSIS — E7800 Pure hypercholesterolemia, unspecified: Secondary | ICD-10-CM | POA: Diagnosis not present

## 2017-12-22 DIAGNOSIS — Z6827 Body mass index (BMI) 27.0-27.9, adult: Secondary | ICD-10-CM | POA: Diagnosis not present

## 2017-12-22 DIAGNOSIS — Z789 Other specified health status: Secondary | ICD-10-CM | POA: Diagnosis not present

## 2017-12-22 DIAGNOSIS — N400 Enlarged prostate without lower urinary tract symptoms: Secondary | ICD-10-CM | POA: Diagnosis not present

## 2017-12-22 DIAGNOSIS — Z299 Encounter for prophylactic measures, unspecified: Secondary | ICD-10-CM | POA: Diagnosis not present

## 2017-12-22 DIAGNOSIS — I10 Essential (primary) hypertension: Secondary | ICD-10-CM | POA: Diagnosis not present

## 2017-12-22 DIAGNOSIS — M109 Gout, unspecified: Secondary | ICD-10-CM | POA: Diagnosis not present

## 2017-12-22 DIAGNOSIS — M1710 Unilateral primary osteoarthritis, unspecified knee: Secondary | ICD-10-CM | POA: Diagnosis not present

## 2018-02-16 DIAGNOSIS — R2681 Unsteadiness on feet: Secondary | ICD-10-CM | POA: Diagnosis not present

## 2018-02-16 DIAGNOSIS — Z6827 Body mass index (BMI) 27.0-27.9, adult: Secondary | ICD-10-CM | POA: Diagnosis not present

## 2018-02-16 DIAGNOSIS — E1165 Type 2 diabetes mellitus with hyperglycemia: Secondary | ICD-10-CM | POA: Diagnosis not present

## 2018-02-16 DIAGNOSIS — I10 Essential (primary) hypertension: Secondary | ICD-10-CM | POA: Diagnosis not present

## 2018-02-16 DIAGNOSIS — Z2821 Immunization not carried out because of patient refusal: Secondary | ICD-10-CM | POA: Diagnosis not present

## 2018-02-16 DIAGNOSIS — Z299 Encounter for prophylactic measures, unspecified: Secondary | ICD-10-CM | POA: Diagnosis not present

## 2018-02-17 DIAGNOSIS — E1165 Type 2 diabetes mellitus with hyperglycemia: Secondary | ICD-10-CM | POA: Diagnosis not present

## 2018-02-17 DIAGNOSIS — N390 Urinary tract infection, site not specified: Secondary | ICD-10-CM | POA: Diagnosis not present

## 2018-02-28 DIAGNOSIS — M1711 Unilateral primary osteoarthritis, right knee: Secondary | ICD-10-CM | POA: Diagnosis not present

## 2018-03-06 DIAGNOSIS — Z713 Dietary counseling and surveillance: Secondary | ICD-10-CM | POA: Diagnosis not present

## 2018-03-06 DIAGNOSIS — Z299 Encounter for prophylactic measures, unspecified: Secondary | ICD-10-CM | POA: Diagnosis not present

## 2018-03-06 DIAGNOSIS — I10 Essential (primary) hypertension: Secondary | ICD-10-CM | POA: Diagnosis not present

## 2018-03-06 DIAGNOSIS — L0291 Cutaneous abscess, unspecified: Secondary | ICD-10-CM | POA: Diagnosis not present

## 2018-03-06 DIAGNOSIS — Z6827 Body mass index (BMI) 27.0-27.9, adult: Secondary | ICD-10-CM | POA: Diagnosis not present

## 2018-03-09 DIAGNOSIS — L989 Disorder of the skin and subcutaneous tissue, unspecified: Secondary | ICD-10-CM | POA: Diagnosis not present

## 2018-03-09 DIAGNOSIS — Z299 Encounter for prophylactic measures, unspecified: Secondary | ICD-10-CM | POA: Diagnosis not present

## 2018-03-09 DIAGNOSIS — E1165 Type 2 diabetes mellitus with hyperglycemia: Secondary | ICD-10-CM | POA: Diagnosis not present

## 2018-03-09 DIAGNOSIS — I10 Essential (primary) hypertension: Secondary | ICD-10-CM | POA: Diagnosis not present

## 2018-03-09 DIAGNOSIS — Z6826 Body mass index (BMI) 26.0-26.9, adult: Secondary | ICD-10-CM | POA: Diagnosis not present

## 2018-03-10 DIAGNOSIS — E860 Dehydration: Secondary | ICD-10-CM | POA: Diagnosis not present

## 2018-03-10 DIAGNOSIS — L02811 Cutaneous abscess of head [any part, except face]: Secondary | ICD-10-CM | POA: Diagnosis not present

## 2018-03-10 DIAGNOSIS — S0003XA Contusion of scalp, initial encounter: Secondary | ICD-10-CM | POA: Diagnosis not present

## 2018-03-10 DIAGNOSIS — G20 Parkinson's disease: Secondary | ICD-10-CM | POA: Diagnosis not present

## 2018-03-10 DIAGNOSIS — M109 Gout, unspecified: Secondary | ICD-10-CM | POA: Diagnosis not present

## 2018-03-10 DIAGNOSIS — B9562 Methicillin resistant Staphylococcus aureus infection as the cause of diseases classified elsewhere: Secondary | ICD-10-CM | POA: Diagnosis not present

## 2018-03-10 DIAGNOSIS — I10 Essential (primary) hypertension: Secondary | ICD-10-CM | POA: Diagnosis not present

## 2018-03-10 DIAGNOSIS — W19XXXA Unspecified fall, initial encounter: Secondary | ICD-10-CM | POA: Diagnosis not present

## 2018-03-10 DIAGNOSIS — N179 Acute kidney failure, unspecified: Secondary | ICD-10-CM | POA: Diagnosis not present

## 2018-03-10 DIAGNOSIS — E1165 Type 2 diabetes mellitus with hyperglycemia: Secondary | ICD-10-CM | POA: Diagnosis not present

## 2018-03-10 DIAGNOSIS — R531 Weakness: Secondary | ICD-10-CM | POA: Diagnosis not present

## 2018-03-11 DIAGNOSIS — E860 Dehydration: Secondary | ICD-10-CM | POA: Diagnosis not present

## 2018-03-11 DIAGNOSIS — E1165 Type 2 diabetes mellitus with hyperglycemia: Secondary | ICD-10-CM | POA: Diagnosis not present

## 2018-03-11 DIAGNOSIS — R531 Weakness: Secondary | ICD-10-CM | POA: Diagnosis not present

## 2018-03-11 DIAGNOSIS — B9562 Methicillin resistant Staphylococcus aureus infection as the cause of diseases classified elsewhere: Secondary | ICD-10-CM | POA: Diagnosis not present

## 2018-03-11 DIAGNOSIS — I10 Essential (primary) hypertension: Secondary | ICD-10-CM | POA: Diagnosis not present

## 2018-03-11 DIAGNOSIS — N179 Acute kidney failure, unspecified: Secondary | ICD-10-CM | POA: Diagnosis not present

## 2018-03-11 DIAGNOSIS — G20 Parkinson's disease: Secondary | ICD-10-CM | POA: Diagnosis not present

## 2018-03-11 DIAGNOSIS — L02811 Cutaneous abscess of head [any part, except face]: Secondary | ICD-10-CM | POA: Diagnosis not present

## 2018-03-11 DIAGNOSIS — M109 Gout, unspecified: Secondary | ICD-10-CM | POA: Diagnosis not present

## 2018-03-14 DIAGNOSIS — Z4800 Encounter for change or removal of nonsurgical wound dressing: Secondary | ICD-10-CM | POA: Diagnosis not present

## 2018-03-14 DIAGNOSIS — L02811 Cutaneous abscess of head [any part, except face]: Secondary | ICD-10-CM | POA: Diagnosis not present

## 2018-03-16 ENCOUNTER — Other Ambulatory Visit: Payer: Self-pay | Admitting: *Deleted

## 2018-03-16 NOTE — Patient Outreach (Signed)
[facility]) Care Management  03/16/2018  Vincent Mccoy 11-30-1940 [id number]   Transition of Care Referral   Referral Date: 03/14/18 Referral Source:  [facility] inpatient referral  Date of Admission: 02/28/18 Diagnosis:  s/p Visco supplementation/Synvisc One on 02/28/18 per Dr Rebeca Allegra osteoarthritis of RLL, Date of Discharge:on 03/12/18 .  Facility: [facility]  Insurance:  [facility]  Outreach attempt # 1 successful to home number  Patient is able to verify HIPAA Reviewed and addressed Transitional of care referral with patient Transition of care assessment completed   Social: Vincent Mccoy reports he lives [place] minutes from [facility] at home with his wife Vincent Mccoy. He reports needing some assistance with ADLs and iADLs Vincent Mccoy assists with transportation to medical appointments   Conditions: s/p Visco supplementation/Synvisc One on 02/28/18 per Dr Rebeca Allegra osteoarthritis of RLL, gout, HTN, DM type 2, BPH, HDL,   Medications:denies concerns with taking medications as prescribed, affording medications, side effects of medications and questions about medications  Flu He has already discussed with Dr Sherryll Burger that he does not prefer the flu shot He reports receiving in 2018 and getting "sick as a dog"  dme walker cane crutches, glucose meter  Fall x 1 no injury  Appointments: Dr Sherryll Burger appointment is on 03/22/18   Advance directives: Denies need for assist with advance directives   Consent: [facility] RN CM reviewed [facility] services with patient. Patient gave verbal consent for services. Advised patient that other post discharge calls may occur to assess how the patient is doing following the recent hospitalization. Patient voiced understanding and was appreciative of f/u call. He denies need of services from [facility]/Telephonic RN CM, pharmacy, health coach, NP or SW at this time  Plan: [facility] RN CM will close case at this time as patient has been assessed and  no needs identified.   [facility] RN CM sent a successful outreach letter as discussed with [facility] brochure enclosed for review  Pt encouraged to return a call to [facility] RN CM prn  RN CM provided patient with [facility] 24hr Nurse Line contact [phone].    Terrie Grajales L. Noelle Penner, RN, BSN, CCM [facility] Telephonic Care Management Care Coordinator Direct Number [phone] Mobile number [phone]  Main [facility] number [phone] Fax number [phone]

## 2018-03-17 DIAGNOSIS — L02811 Cutaneous abscess of head [any part, except face]: Secondary | ICD-10-CM | POA: Diagnosis not present

## 2018-03-17 DIAGNOSIS — I10 Essential (primary) hypertension: Secondary | ICD-10-CM | POA: Diagnosis not present

## 2018-03-17 DIAGNOSIS — Z8614 Personal history of Methicillin resistant Staphylococcus aureus infection: Secondary | ICD-10-CM | POA: Diagnosis not present

## 2018-03-17 DIAGNOSIS — S0003XA Contusion of scalp, initial encounter: Secondary | ICD-10-CM | POA: Diagnosis not present

## 2018-03-17 DIAGNOSIS — G20 Parkinson's disease: Secondary | ICD-10-CM | POA: Diagnosis not present

## 2018-03-17 DIAGNOSIS — M109 Gout, unspecified: Secondary | ICD-10-CM | POA: Diagnosis not present

## 2018-03-17 DIAGNOSIS — A4102 Sepsis due to Methicillin resistant Staphylococcus aureus: Secondary | ICD-10-CM | POA: Diagnosis not present

## 2018-03-17 DIAGNOSIS — A411 Sepsis due to other specified staphylococcus: Secondary | ICD-10-CM | POA: Diagnosis not present

## 2018-03-17 DIAGNOSIS — Z794 Long term (current) use of insulin: Secondary | ICD-10-CM | POA: Diagnosis not present

## 2018-03-17 DIAGNOSIS — Z7982 Long term (current) use of aspirin: Secondary | ICD-10-CM | POA: Diagnosis not present

## 2018-03-17 DIAGNOSIS — R531 Weakness: Secondary | ICD-10-CM | POA: Diagnosis not present

## 2018-03-17 DIAGNOSIS — E119 Type 2 diabetes mellitus without complications: Secondary | ICD-10-CM | POA: Diagnosis not present

## 2018-03-17 DIAGNOSIS — R509 Fever, unspecified: Secondary | ICD-10-CM | POA: Diagnosis not present

## 2018-03-22 DIAGNOSIS — A419 Sepsis, unspecified organism: Secondary | ICD-10-CM | POA: Diagnosis not present

## 2018-03-22 DIAGNOSIS — I10 Essential (primary) hypertension: Secondary | ICD-10-CM | POA: Diagnosis not present

## 2018-03-22 DIAGNOSIS — R5383 Other fatigue: Secondary | ICD-10-CM | POA: Diagnosis not present

## 2018-03-22 DIAGNOSIS — Z299 Encounter for prophylactic measures, unspecified: Secondary | ICD-10-CM | POA: Diagnosis not present

## 2018-03-22 DIAGNOSIS — Z6827 Body mass index (BMI) 27.0-27.9, adult: Secondary | ICD-10-CM | POA: Diagnosis not present

## 2018-03-22 DIAGNOSIS — E1165 Type 2 diabetes mellitus with hyperglycemia: Secondary | ICD-10-CM | POA: Diagnosis not present

## 2018-03-24 DIAGNOSIS — R531 Weakness: Secondary | ICD-10-CM | POA: Diagnosis not present

## 2018-03-24 DIAGNOSIS — I10 Essential (primary) hypertension: Secondary | ICD-10-CM | POA: Diagnosis not present

## 2018-03-24 DIAGNOSIS — N390 Urinary tract infection, site not specified: Secondary | ICD-10-CM | POA: Diagnosis not present

## 2018-03-24 DIAGNOSIS — E119 Type 2 diabetes mellitus without complications: Secondary | ICD-10-CM | POA: Diagnosis not present

## 2018-03-24 DIAGNOSIS — Z23 Encounter for immunization: Secondary | ICD-10-CM | POA: Diagnosis not present

## 2018-03-24 DIAGNOSIS — R42 Dizziness and giddiness: Secondary | ICD-10-CM | POA: Diagnosis not present

## 2018-03-24 DIAGNOSIS — N179 Acute kidney failure, unspecified: Secondary | ICD-10-CM | POA: Diagnosis not present

## 2018-03-24 DIAGNOSIS — E860 Dehydration: Secondary | ICD-10-CM | POA: Diagnosis not present

## 2018-03-24 DIAGNOSIS — E876 Hypokalemia: Secondary | ICD-10-CM | POA: Diagnosis not present

## 2018-03-24 DIAGNOSIS — R55 Syncope and collapse: Secondary | ICD-10-CM | POA: Diagnosis not present

## 2018-03-24 DIAGNOSIS — R5381 Other malaise: Secondary | ICD-10-CM | POA: Diagnosis not present

## 2018-03-25 DIAGNOSIS — E119 Type 2 diabetes mellitus without complications: Secondary | ICD-10-CM | POA: Diagnosis not present

## 2018-03-25 DIAGNOSIS — R531 Weakness: Secondary | ICD-10-CM | POA: Diagnosis not present

## 2018-03-25 DIAGNOSIS — I10 Essential (primary) hypertension: Secondary | ICD-10-CM | POA: Diagnosis not present

## 2018-03-25 DIAGNOSIS — Z23 Encounter for immunization: Secondary | ICD-10-CM | POA: Diagnosis not present

## 2018-03-25 DIAGNOSIS — N179 Acute kidney failure, unspecified: Secondary | ICD-10-CM | POA: Diagnosis not present

## 2018-03-25 DIAGNOSIS — E876 Hypokalemia: Secondary | ICD-10-CM | POA: Diagnosis not present

## 2018-03-25 DIAGNOSIS — R5381 Other malaise: Secondary | ICD-10-CM | POA: Diagnosis not present

## 2018-03-25 DIAGNOSIS — R55 Syncope and collapse: Secondary | ICD-10-CM | POA: Diagnosis not present

## 2018-03-25 DIAGNOSIS — E860 Dehydration: Secondary | ICD-10-CM | POA: Diagnosis not present

## 2018-03-26 DIAGNOSIS — I10 Essential (primary) hypertension: Secondary | ICD-10-CM | POA: Diagnosis not present

## 2018-03-26 DIAGNOSIS — R55 Syncope and collapse: Secondary | ICD-10-CM | POA: Diagnosis not present

## 2018-03-26 DIAGNOSIS — E860 Dehydration: Secondary | ICD-10-CM | POA: Diagnosis not present

## 2018-03-26 DIAGNOSIS — E119 Type 2 diabetes mellitus without complications: Secondary | ICD-10-CM | POA: Diagnosis not present

## 2018-03-26 DIAGNOSIS — R5381 Other malaise: Secondary | ICD-10-CM | POA: Diagnosis not present

## 2018-03-26 DIAGNOSIS — N179 Acute kidney failure, unspecified: Secondary | ICD-10-CM | POA: Diagnosis not present

## 2018-03-26 DIAGNOSIS — R531 Weakness: Secondary | ICD-10-CM | POA: Diagnosis not present

## 2018-03-26 DIAGNOSIS — E876 Hypokalemia: Secondary | ICD-10-CM | POA: Diagnosis not present

## 2018-03-26 DIAGNOSIS — Z23 Encounter for immunization: Secondary | ICD-10-CM | POA: Diagnosis not present

## 2018-03-31 DIAGNOSIS — E1165 Type 2 diabetes mellitus with hyperglycemia: Secondary | ICD-10-CM | POA: Diagnosis not present

## 2018-03-31 DIAGNOSIS — I10 Essential (primary) hypertension: Secondary | ICD-10-CM | POA: Diagnosis not present

## 2018-03-31 DIAGNOSIS — Z299 Encounter for prophylactic measures, unspecified: Secondary | ICD-10-CM | POA: Diagnosis not present

## 2018-03-31 DIAGNOSIS — N400 Enlarged prostate without lower urinary tract symptoms: Secondary | ICD-10-CM | POA: Diagnosis not present

## 2018-03-31 DIAGNOSIS — Z6826 Body mass index (BMI) 26.0-26.9, adult: Secondary | ICD-10-CM | POA: Diagnosis not present

## 2018-03-31 DIAGNOSIS — R05 Cough: Secondary | ICD-10-CM | POA: Diagnosis not present

## 2018-04-04 DIAGNOSIS — I10 Essential (primary) hypertension: Secondary | ICD-10-CM | POA: Diagnosis not present

## 2018-04-04 DIAGNOSIS — E1165 Type 2 diabetes mellitus with hyperglycemia: Secondary | ICD-10-CM | POA: Diagnosis not present

## 2018-04-04 DIAGNOSIS — E7800 Pure hypercholesterolemia, unspecified: Secondary | ICD-10-CM | POA: Diagnosis not present

## 2018-04-04 DIAGNOSIS — R5383 Other fatigue: Secondary | ICD-10-CM | POA: Diagnosis not present

## 2018-04-04 DIAGNOSIS — Z6826 Body mass index (BMI) 26.0-26.9, adult: Secondary | ICD-10-CM | POA: Diagnosis not present

## 2018-04-04 DIAGNOSIS — Z299 Encounter for prophylactic measures, unspecified: Secondary | ICD-10-CM | POA: Diagnosis not present

## 2018-04-12 DIAGNOSIS — Z6826 Body mass index (BMI) 26.0-26.9, adult: Secondary | ICD-10-CM | POA: Diagnosis not present

## 2018-04-12 DIAGNOSIS — Z299 Encounter for prophylactic measures, unspecified: Secondary | ICD-10-CM | POA: Diagnosis not present

## 2018-04-12 DIAGNOSIS — N183 Chronic kidney disease, stage 3 (moderate): Secondary | ICD-10-CM | POA: Diagnosis not present

## 2018-04-12 DIAGNOSIS — I10 Essential (primary) hypertension: Secondary | ICD-10-CM | POA: Diagnosis not present

## 2018-04-12 DIAGNOSIS — E1165 Type 2 diabetes mellitus with hyperglycemia: Secondary | ICD-10-CM | POA: Diagnosis not present

## 2018-04-12 DIAGNOSIS — R21 Rash and other nonspecific skin eruption: Secondary | ICD-10-CM | POA: Diagnosis not present

## 2018-04-17 DIAGNOSIS — E1165 Type 2 diabetes mellitus with hyperglycemia: Secondary | ICD-10-CM | POA: Diagnosis not present

## 2018-04-17 DIAGNOSIS — E7800 Pure hypercholesterolemia, unspecified: Secondary | ICD-10-CM | POA: Diagnosis not present

## 2018-04-17 DIAGNOSIS — Z6826 Body mass index (BMI) 26.0-26.9, adult: Secondary | ICD-10-CM | POA: Diagnosis not present

## 2018-04-17 DIAGNOSIS — R600 Localized edema: Secondary | ICD-10-CM | POA: Diagnosis not present

## 2018-04-17 DIAGNOSIS — Z299 Encounter for prophylactic measures, unspecified: Secondary | ICD-10-CM | POA: Diagnosis not present

## 2018-04-17 DIAGNOSIS — I10 Essential (primary) hypertension: Secondary | ICD-10-CM | POA: Diagnosis not present

## 2018-04-25 ENCOUNTER — Ambulatory Visit: Payer: Medicare HMO | Admitting: Urology

## 2018-04-25 DIAGNOSIS — R3915 Urgency of urination: Secondary | ICD-10-CM | POA: Diagnosis not present

## 2018-04-25 DIAGNOSIS — R350 Frequency of micturition: Secondary | ICD-10-CM

## 2018-04-27 DIAGNOSIS — E7800 Pure hypercholesterolemia, unspecified: Secondary | ICD-10-CM | POA: Diagnosis not present

## 2018-04-27 DIAGNOSIS — Z299 Encounter for prophylactic measures, unspecified: Secondary | ICD-10-CM | POA: Diagnosis not present

## 2018-04-27 DIAGNOSIS — Z6827 Body mass index (BMI) 27.0-27.9, adult: Secondary | ICD-10-CM | POA: Diagnosis not present

## 2018-04-27 DIAGNOSIS — I10 Essential (primary) hypertension: Secondary | ICD-10-CM | POA: Diagnosis not present

## 2018-04-27 DIAGNOSIS — E1165 Type 2 diabetes mellitus with hyperglycemia: Secondary | ICD-10-CM | POA: Diagnosis not present

## 2018-05-04 DIAGNOSIS — Z6827 Body mass index (BMI) 27.0-27.9, adult: Secondary | ICD-10-CM | POA: Diagnosis not present

## 2018-05-04 DIAGNOSIS — Z299 Encounter for prophylactic measures, unspecified: Secondary | ICD-10-CM | POA: Diagnosis not present

## 2018-05-04 DIAGNOSIS — I10 Essential (primary) hypertension: Secondary | ICD-10-CM | POA: Diagnosis not present

## 2018-05-04 DIAGNOSIS — E1165 Type 2 diabetes mellitus with hyperglycemia: Secondary | ICD-10-CM | POA: Diagnosis not present

## 2018-05-04 DIAGNOSIS — R600 Localized edema: Secondary | ICD-10-CM | POA: Diagnosis not present

## 2018-06-21 DIAGNOSIS — M1711 Unilateral primary osteoarthritis, right knee: Secondary | ICD-10-CM | POA: Diagnosis not present

## 2018-06-21 DIAGNOSIS — S72341P Displaced spiral fracture of shaft of right femur, subsequent encounter for closed fracture with malunion: Secondary | ICD-10-CM | POA: Diagnosis not present

## 2018-07-19 DIAGNOSIS — E1165 Type 2 diabetes mellitus with hyperglycemia: Secondary | ICD-10-CM | POA: Diagnosis not present

## 2018-07-19 DIAGNOSIS — Z6827 Body mass index (BMI) 27.0-27.9, adult: Secondary | ICD-10-CM | POA: Diagnosis not present

## 2018-07-19 DIAGNOSIS — Z299 Encounter for prophylactic measures, unspecified: Secondary | ICD-10-CM | POA: Diagnosis not present

## 2018-07-19 DIAGNOSIS — I10 Essential (primary) hypertension: Secondary | ICD-10-CM | POA: Diagnosis not present

## 2018-07-19 DIAGNOSIS — Z87891 Personal history of nicotine dependence: Secondary | ICD-10-CM | POA: Diagnosis not present

## 2018-08-03 DIAGNOSIS — M1712 Unilateral primary osteoarthritis, left knee: Secondary | ICD-10-CM | POA: Diagnosis not present

## 2018-08-03 DIAGNOSIS — E1165 Type 2 diabetes mellitus with hyperglycemia: Secondary | ICD-10-CM | POA: Diagnosis not present

## 2018-08-03 DIAGNOSIS — R350 Frequency of micturition: Secondary | ICD-10-CM | POA: Diagnosis not present

## 2018-08-03 DIAGNOSIS — I10 Essential (primary) hypertension: Secondary | ICD-10-CM | POA: Diagnosis not present

## 2018-08-03 DIAGNOSIS — Z299 Encounter for prophylactic measures, unspecified: Secondary | ICD-10-CM | POA: Diagnosis not present

## 2018-08-03 DIAGNOSIS — Z6827 Body mass index (BMI) 27.0-27.9, adult: Secondary | ICD-10-CM | POA: Diagnosis not present

## 2018-09-13 DIAGNOSIS — Z6827 Body mass index (BMI) 27.0-27.9, adult: Secondary | ICD-10-CM | POA: Diagnosis not present

## 2018-09-13 DIAGNOSIS — E1165 Type 2 diabetes mellitus with hyperglycemia: Secondary | ICD-10-CM | POA: Diagnosis not present

## 2018-09-13 DIAGNOSIS — Z713 Dietary counseling and surveillance: Secondary | ICD-10-CM | POA: Diagnosis not present

## 2018-09-13 DIAGNOSIS — Z299 Encounter for prophylactic measures, unspecified: Secondary | ICD-10-CM | POA: Diagnosis not present

## 2018-10-25 DIAGNOSIS — Z299 Encounter for prophylactic measures, unspecified: Secondary | ICD-10-CM | POA: Diagnosis not present

## 2018-10-25 DIAGNOSIS — Z6827 Body mass index (BMI) 27.0-27.9, adult: Secondary | ICD-10-CM | POA: Diagnosis not present

## 2018-10-25 DIAGNOSIS — Z713 Dietary counseling and surveillance: Secondary | ICD-10-CM | POA: Diagnosis not present

## 2018-10-25 DIAGNOSIS — I10 Essential (primary) hypertension: Secondary | ICD-10-CM | POA: Diagnosis not present

## 2018-10-25 DIAGNOSIS — E1165 Type 2 diabetes mellitus with hyperglycemia: Secondary | ICD-10-CM | POA: Diagnosis not present

## 2019-01-25 DIAGNOSIS — Z1339 Encounter for screening examination for other mental health and behavioral disorders: Secondary | ICD-10-CM | POA: Diagnosis not present

## 2019-01-25 DIAGNOSIS — Z1331 Encounter for screening for depression: Secondary | ICD-10-CM | POA: Diagnosis not present

## 2019-01-25 DIAGNOSIS — Z7189 Other specified counseling: Secondary | ICD-10-CM | POA: Diagnosis not present

## 2019-01-25 DIAGNOSIS — Z0000 Encounter for general adult medical examination without abnormal findings: Secondary | ICD-10-CM | POA: Diagnosis not present

## 2019-01-25 DIAGNOSIS — R5383 Other fatigue: Secondary | ICD-10-CM | POA: Diagnosis not present

## 2019-01-25 DIAGNOSIS — N400 Enlarged prostate without lower urinary tract symptoms: Secondary | ICD-10-CM | POA: Diagnosis not present

## 2019-01-25 DIAGNOSIS — Z79899 Other long term (current) drug therapy: Secondary | ICD-10-CM | POA: Diagnosis not present

## 2019-01-25 DIAGNOSIS — Z1211 Encounter for screening for malignant neoplasm of colon: Secondary | ICD-10-CM | POA: Diagnosis not present

## 2019-01-25 DIAGNOSIS — E7800 Pure hypercholesterolemia, unspecified: Secondary | ICD-10-CM | POA: Diagnosis not present

## 2019-01-25 DIAGNOSIS — E1165 Type 2 diabetes mellitus with hyperglycemia: Secondary | ICD-10-CM | POA: Diagnosis not present

## 2019-02-05 DIAGNOSIS — S72341P Displaced spiral fracture of shaft of right femur, subsequent encounter for closed fracture with malunion: Secondary | ICD-10-CM | POA: Diagnosis not present

## 2019-02-05 DIAGNOSIS — M1711 Unilateral primary osteoarthritis, right knee: Secondary | ICD-10-CM | POA: Diagnosis not present

## 2019-02-05 DIAGNOSIS — M1710 Unilateral primary osteoarthritis, unspecified knee: Secondary | ICD-10-CM | POA: Diagnosis not present

## 2019-02-19 DIAGNOSIS — Z23 Encounter for immunization: Secondary | ICD-10-CM | POA: Diagnosis not present

## 2019-05-01 DIAGNOSIS — E1165 Type 2 diabetes mellitus with hyperglycemia: Secondary | ICD-10-CM | POA: Diagnosis not present

## 2019-05-01 DIAGNOSIS — I10 Essential (primary) hypertension: Secondary | ICD-10-CM | POA: Diagnosis not present

## 2019-05-01 DIAGNOSIS — H524 Presbyopia: Secondary | ICD-10-CM | POA: Diagnosis not present

## 2019-05-01 DIAGNOSIS — Z6827 Body mass index (BMI) 27.0-27.9, adult: Secondary | ICD-10-CM | POA: Diagnosis not present

## 2019-05-01 DIAGNOSIS — Z713 Dietary counseling and surveillance: Secondary | ICD-10-CM | POA: Diagnosis not present

## 2019-05-01 DIAGNOSIS — Z299 Encounter for prophylactic measures, unspecified: Secondary | ICD-10-CM | POA: Diagnosis not present

## 2019-06-28 ENCOUNTER — Other Ambulatory Visit: Payer: Self-pay

## 2019-06-28 ENCOUNTER — Ambulatory Visit: Payer: Medicare HMO | Attending: Internal Medicine

## 2019-06-28 DIAGNOSIS — Z23 Encounter for immunization: Secondary | ICD-10-CM | POA: Insufficient documentation

## 2019-06-28 NOTE — Progress Notes (Signed)
   Covid-19 Vaccination Clinic  Name:  Vincent Mccoy    MRN: [id number] DOB: Apr 27, 1941  06/28/2019  Mr. Vincent Mccoy was observed post Covid-19 immunization for 15 minutes without incidence. He was provided with Vaccine Information Sheet and instruction to access the V-Safe system.   Mr. Vincent Mccoy was instructed to call 911 with any severe reactions post vaccine: Marland Kitchen Difficulty breathing  . Swelling of your face and throat  . A fast heartbeat  . A bad rash all over your body  . Dizziness and weakness    Immunizations Administered    Name Date Dose VIS Date Route   Moderna COVID-19 Vaccine 06/28/2019 12:24 PM 0.5 mL 04/17/2019 Intramuscular   Manufacturer: Moderna   Lot: [id number]   NDC: 65035-465-68

## 2019-07-15 DIAGNOSIS — N400 Enlarged prostate without lower urinary tract symptoms: Secondary | ICD-10-CM | POA: Diagnosis not present

## 2019-07-15 DIAGNOSIS — M109 Gout, unspecified: Secondary | ICD-10-CM | POA: Diagnosis not present

## 2019-07-30 ENCOUNTER — Ambulatory Visit: Payer: Medicare PPO | Attending: Internal Medicine

## 2019-07-30 DIAGNOSIS — Z23 Encounter for immunization: Secondary | ICD-10-CM

## 2019-07-30 NOTE — Progress Notes (Signed)
   Covid-19 Vaccination Clinic  Name:  Vincent Mccoy    MRN: [id number] DOB: 09-Nov-1940  07/30/2019  [name] was observed post Covid-19 immunization for 15 minutes without incident. He was provided with Vaccine Information Sheet and instruction to access the V-Safe system.   [name] was instructed to call 911 with any severe reactions post vaccine: Marland Kitchen Difficulty breathing  . Swelling of face and throat  . A fast heartbeat  . A bad rash all over body  . Dizziness and weakness   Immunizations Administered    Name Date Dose VIS Date Route   Moderna COVID-19 Vaccine 07/30/2019 10:11 AM 0.5 mL 04/17/2019 Intramuscular   Manufacturer: Moderna   Lot: [id number]   NDC: 48250-037-04

## 2019-08-08 DIAGNOSIS — Z789 Other specified health status: Secondary | ICD-10-CM | POA: Diagnosis not present

## 2019-08-08 DIAGNOSIS — I10 Essential (primary) hypertension: Secondary | ICD-10-CM | POA: Diagnosis not present

## 2019-08-08 DIAGNOSIS — E1165 Type 2 diabetes mellitus with hyperglycemia: Secondary | ICD-10-CM | POA: Diagnosis not present

## 2019-08-08 DIAGNOSIS — Z299 Encounter for prophylactic measures, unspecified: Secondary | ICD-10-CM | POA: Diagnosis not present

## 2019-08-08 DIAGNOSIS — Z6828 Body mass index (BMI) 28.0-28.9, adult: Secondary | ICD-10-CM | POA: Diagnosis not present

## 2019-09-14 DIAGNOSIS — M109 Gout, unspecified: Secondary | ICD-10-CM | POA: Diagnosis not present

## 2019-09-14 DIAGNOSIS — E1165 Type 2 diabetes mellitus with hyperglycemia: Secondary | ICD-10-CM | POA: Diagnosis not present

## 2019-09-14 DIAGNOSIS — I10 Essential (primary) hypertension: Secondary | ICD-10-CM | POA: Diagnosis not present

## 2019-09-19 DIAGNOSIS — E1165 Type 2 diabetes mellitus with hyperglycemia: Secondary | ICD-10-CM | POA: Diagnosis not present

## 2019-09-19 DIAGNOSIS — Z299 Encounter for prophylactic measures, unspecified: Secondary | ICD-10-CM | POA: Diagnosis not present

## 2019-09-19 DIAGNOSIS — E1129 Type 2 diabetes mellitus with other diabetic kidney complication: Secondary | ICD-10-CM | POA: Diagnosis not present

## 2019-09-19 DIAGNOSIS — I10 Essential (primary) hypertension: Secondary | ICD-10-CM | POA: Diagnosis not present

## 2019-10-14 DIAGNOSIS — E1165 Type 2 diabetes mellitus with hyperglycemia: Secondary | ICD-10-CM | POA: Diagnosis not present

## 2019-10-15 DIAGNOSIS — M109 Gout, unspecified: Secondary | ICD-10-CM | POA: Diagnosis not present

## 2019-10-15 DIAGNOSIS — I10 Essential (primary) hypertension: Secondary | ICD-10-CM | POA: Diagnosis not present

## 2019-11-14 DIAGNOSIS — E1165 Type 2 diabetes mellitus with hyperglycemia: Secondary | ICD-10-CM | POA: Diagnosis not present

## 2019-11-15 DIAGNOSIS — E1165 Type 2 diabetes mellitus with hyperglycemia: Secondary | ICD-10-CM | POA: Diagnosis not present

## 2019-11-15 DIAGNOSIS — I10 Essential (primary) hypertension: Secondary | ICD-10-CM | POA: Diagnosis not present

## 2019-11-15 DIAGNOSIS — Z299 Encounter for prophylactic measures, unspecified: Secondary | ICD-10-CM | POA: Diagnosis not present

## 2019-12-14 DIAGNOSIS — E1165 Type 2 diabetes mellitus with hyperglycemia: Secondary | ICD-10-CM | POA: Diagnosis not present

## 2019-12-14 DIAGNOSIS — E785 Hyperlipidemia, unspecified: Secondary | ICD-10-CM | POA: Diagnosis not present

## 2019-12-14 DIAGNOSIS — M1712 Unilateral primary osteoarthritis, left knee: Secondary | ICD-10-CM | POA: Diagnosis not present

## 2019-12-14 DIAGNOSIS — I10 Essential (primary) hypertension: Secondary | ICD-10-CM | POA: Diagnosis not present

## 2019-12-21 DIAGNOSIS — E0922 Drug or chemical induced diabetes mellitus with diabetic chronic kidney disease: Secondary | ICD-10-CM | POA: Diagnosis not present

## 2019-12-21 DIAGNOSIS — Z22322 Carrier or suspected carrier of Methicillin resistant Staphylococcus aureus: Secondary | ICD-10-CM | POA: Diagnosis not present

## 2019-12-21 DIAGNOSIS — M1711 Unilateral primary osteoarthritis, right knee: Secondary | ICD-10-CM | POA: Diagnosis not present

## 2019-12-28 DIAGNOSIS — M1711 Unilateral primary osteoarthritis, right knee: Secondary | ICD-10-CM | POA: Diagnosis not present

## 2020-01-04 DIAGNOSIS — E1165 Type 2 diabetes mellitus with hyperglycemia: Secondary | ICD-10-CM | POA: Diagnosis not present

## 2020-01-04 DIAGNOSIS — M1711 Unilateral primary osteoarthritis, right knee: Secondary | ICD-10-CM | POA: Diagnosis not present

## 2020-01-04 DIAGNOSIS — I10 Essential (primary) hypertension: Secondary | ICD-10-CM | POA: Diagnosis not present

## 2020-01-04 DIAGNOSIS — Z299 Encounter for prophylactic measures, unspecified: Secondary | ICD-10-CM | POA: Diagnosis not present

## 2020-01-15 DIAGNOSIS — E1165 Type 2 diabetes mellitus with hyperglycemia: Secondary | ICD-10-CM | POA: Diagnosis not present

## 2020-01-29 DIAGNOSIS — Z125 Encounter for screening for malignant neoplasm of prostate: Secondary | ICD-10-CM | POA: Diagnosis not present

## 2020-01-29 DIAGNOSIS — Z1339 Encounter for screening examination for other mental health and behavioral disorders: Secondary | ICD-10-CM | POA: Diagnosis not present

## 2020-01-29 DIAGNOSIS — E7800 Pure hypercholesterolemia, unspecified: Secondary | ICD-10-CM | POA: Diagnosis not present

## 2020-01-29 DIAGNOSIS — Z299 Encounter for prophylactic measures, unspecified: Secondary | ICD-10-CM | POA: Diagnosis not present

## 2020-01-29 DIAGNOSIS — Z1331 Encounter for screening for depression: Secondary | ICD-10-CM | POA: Diagnosis not present

## 2020-01-29 DIAGNOSIS — R5383 Other fatigue: Secondary | ICD-10-CM | POA: Diagnosis not present

## 2020-01-29 DIAGNOSIS — Z7189 Other specified counseling: Secondary | ICD-10-CM | POA: Diagnosis not present

## 2020-01-29 DIAGNOSIS — Z79899 Other long term (current) drug therapy: Secondary | ICD-10-CM | POA: Diagnosis not present

## 2020-01-29 DIAGNOSIS — Z0000 Encounter for general adult medical examination without abnormal findings: Secondary | ICD-10-CM | POA: Diagnosis not present

## 2020-01-29 DIAGNOSIS — Z6827 Body mass index (BMI) 27.0-27.9, adult: Secondary | ICD-10-CM | POA: Diagnosis not present

## 2020-01-29 DIAGNOSIS — E1165 Type 2 diabetes mellitus with hyperglycemia: Secondary | ICD-10-CM | POA: Diagnosis not present

## 2020-01-30 DIAGNOSIS — Z22322 Carrier or suspected carrier of Methicillin resistant Staphylococcus aureus: Secondary | ICD-10-CM | POA: Diagnosis not present

## 2020-01-30 DIAGNOSIS — E0922 Drug or chemical induced diabetes mellitus with diabetic chronic kidney disease: Secondary | ICD-10-CM | POA: Diagnosis not present

## 2020-01-30 DIAGNOSIS — Z01812 Encounter for preprocedural laboratory examination: Secondary | ICD-10-CM | POA: Diagnosis not present

## 2020-02-04 DIAGNOSIS — Z01812 Encounter for preprocedural laboratory examination: Secondary | ICD-10-CM | POA: Diagnosis not present

## 2020-02-04 DIAGNOSIS — K7200 Acute and subacute hepatic failure without coma: Secondary | ICD-10-CM | POA: Diagnosis not present

## 2020-02-04 DIAGNOSIS — E0922 Drug or chemical induced diabetes mellitus with diabetic chronic kidney disease: Secondary | ICD-10-CM | POA: Diagnosis not present

## 2020-02-04 DIAGNOSIS — M1711 Unilateral primary osteoarthritis, right knee: Secondary | ICD-10-CM | POA: Diagnosis not present

## 2020-02-08 DIAGNOSIS — E0922 Drug or chemical induced diabetes mellitus with diabetic chronic kidney disease: Secondary | ICD-10-CM | POA: Diagnosis not present

## 2020-02-08 DIAGNOSIS — Z01818 Encounter for other preprocedural examination: Secondary | ICD-10-CM | POA: Diagnosis not present

## 2020-02-08 DIAGNOSIS — Z01812 Encounter for preprocedural laboratory examination: Secondary | ICD-10-CM | POA: Diagnosis not present

## 2020-02-08 DIAGNOSIS — M1711 Unilateral primary osteoarthritis, right knee: Secondary | ICD-10-CM | POA: Diagnosis not present

## 2020-02-12 DIAGNOSIS — E785 Hyperlipidemia, unspecified: Secondary | ICD-10-CM | POA: Diagnosis not present

## 2020-02-12 DIAGNOSIS — Z7984 Long term (current) use of oral hypoglycemic drugs: Secondary | ICD-10-CM | POA: Diagnosis not present

## 2020-02-12 DIAGNOSIS — Z7982 Long term (current) use of aspirin: Secondary | ICD-10-CM | POA: Diagnosis not present

## 2020-02-12 DIAGNOSIS — Z87891 Personal history of nicotine dependence: Secondary | ICD-10-CM | POA: Diagnosis not present

## 2020-02-12 DIAGNOSIS — M1711 Unilateral primary osteoarthritis, right knee: Secondary | ICD-10-CM | POA: Diagnosis not present

## 2020-02-12 DIAGNOSIS — R41841 Cognitive communication deficit: Secondary | ICD-10-CM | POA: Diagnosis not present

## 2020-02-12 DIAGNOSIS — Z7401 Bed confinement status: Secondary | ICD-10-CM | POA: Diagnosis not present

## 2020-02-12 DIAGNOSIS — M21061 Valgus deformity, not elsewhere classified, right knee: Secondary | ICD-10-CM | POA: Diagnosis not present

## 2020-02-12 DIAGNOSIS — R2689 Other abnormalities of gait and mobility: Secondary | ICD-10-CM | POA: Diagnosis not present

## 2020-02-12 DIAGNOSIS — Z96653 Presence of artificial knee joint, bilateral: Secondary | ICD-10-CM | POA: Diagnosis not present

## 2020-02-12 DIAGNOSIS — E1165 Type 2 diabetes mellitus with hyperglycemia: Secondary | ICD-10-CM | POA: Diagnosis not present

## 2020-02-12 DIAGNOSIS — M1712 Unilateral primary osteoarthritis, left knee: Secondary | ICD-10-CM | POA: Diagnosis not present

## 2020-02-12 DIAGNOSIS — Z96651 Presence of right artificial knee joint: Secondary | ICD-10-CM | POA: Diagnosis not present

## 2020-02-12 DIAGNOSIS — M6281 Muscle weakness (generalized): Secondary | ICD-10-CM | POA: Diagnosis not present

## 2020-02-12 DIAGNOSIS — G8918 Other acute postprocedural pain: Secondary | ICD-10-CM | POA: Diagnosis not present

## 2020-02-12 DIAGNOSIS — Z20822 Contact with and (suspected) exposure to covid-19: Secondary | ICD-10-CM | POA: Diagnosis not present

## 2020-02-12 DIAGNOSIS — Z882 Allergy status to sulfonamides status: Secondary | ICD-10-CM | POA: Diagnosis not present

## 2020-02-12 DIAGNOSIS — M109 Gout, unspecified: Secondary | ICD-10-CM | POA: Diagnosis not present

## 2020-02-12 DIAGNOSIS — R531 Weakness: Secondary | ICD-10-CM | POA: Diagnosis not present

## 2020-02-12 DIAGNOSIS — E119 Type 2 diabetes mellitus without complications: Secondary | ICD-10-CM | POA: Diagnosis not present

## 2020-02-12 DIAGNOSIS — R5381 Other malaise: Secondary | ICD-10-CM | POA: Diagnosis not present

## 2020-02-12 DIAGNOSIS — I10 Essential (primary) hypertension: Secondary | ICD-10-CM | POA: Diagnosis not present

## 2020-02-12 DIAGNOSIS — Z471 Aftercare following joint replacement surgery: Secondary | ICD-10-CM | POA: Diagnosis not present

## 2020-02-14 DIAGNOSIS — E1165 Type 2 diabetes mellitus with hyperglycemia: Secondary | ICD-10-CM | POA: Diagnosis not present

## 2020-02-14 DIAGNOSIS — I10 Essential (primary) hypertension: Secondary | ICD-10-CM | POA: Diagnosis not present

## 2020-02-14 DIAGNOSIS — M1712 Unilateral primary osteoarthritis, left knee: Secondary | ICD-10-CM | POA: Diagnosis not present

## 2020-02-14 DIAGNOSIS — E785 Hyperlipidemia, unspecified: Secondary | ICD-10-CM | POA: Diagnosis not present

## 2020-02-15 DIAGNOSIS — R600 Localized edema: Secondary | ICD-10-CM | POA: Diagnosis not present

## 2020-02-15 DIAGNOSIS — Z7401 Bed confinement status: Secondary | ICD-10-CM | POA: Diagnosis not present

## 2020-02-15 DIAGNOSIS — I10 Essential (primary) hypertension: Secondary | ICD-10-CM | POA: Diagnosis not present

## 2020-02-15 DIAGNOSIS — Z299 Encounter for prophylactic measures, unspecified: Secondary | ICD-10-CM | POA: Diagnosis not present

## 2020-02-15 DIAGNOSIS — M545 Low back pain, unspecified: Secondary | ICD-10-CM | POA: Diagnosis not present

## 2020-02-15 DIAGNOSIS — M6281 Muscle weakness (generalized): Secondary | ICD-10-CM | POA: Diagnosis not present

## 2020-02-15 DIAGNOSIS — I824Z9 Acute embolism and thrombosis of unspecified deep veins of unspecified distal lower extremity: Secondary | ICD-10-CM | POA: Diagnosis present

## 2020-02-15 DIAGNOSIS — Z471 Aftercare following joint replacement surgery: Secondary | ICD-10-CM | POA: Diagnosis not present

## 2020-02-15 DIAGNOSIS — R2689 Other abnormalities of gait and mobility: Secondary | ICD-10-CM | POA: Diagnosis not present

## 2020-02-15 DIAGNOSIS — E1129 Type 2 diabetes mellitus with other diabetic kidney complication: Secondary | ICD-10-CM | POA: Diagnosis not present

## 2020-02-15 DIAGNOSIS — R41841 Cognitive communication deficit: Secondary | ICD-10-CM | POA: Diagnosis not present

## 2020-02-15 DIAGNOSIS — Z9181 History of falling: Secondary | ICD-10-CM | POA: Diagnosis not present

## 2020-02-15 DIAGNOSIS — M79604 Pain in right leg: Secondary | ICD-10-CM | POA: Diagnosis not present

## 2020-02-15 DIAGNOSIS — Z96651 Presence of right artificial knee joint: Secondary | ICD-10-CM | POA: Diagnosis not present

## 2020-02-15 DIAGNOSIS — E119 Type 2 diabetes mellitus without complications: Secondary | ICD-10-CM | POA: Diagnosis not present

## 2020-02-15 DIAGNOSIS — R531 Weakness: Secondary | ICD-10-CM | POA: Diagnosis not present

## 2020-02-15 DIAGNOSIS — M109 Gout, unspecified: Secondary | ICD-10-CM | POA: Diagnosis not present

## 2020-02-15 DIAGNOSIS — E785 Hyperlipidemia, unspecified: Secondary | ICD-10-CM | POA: Diagnosis not present

## 2020-02-15 DIAGNOSIS — M79661 Pain in right lower leg: Secondary | ICD-10-CM | POA: Diagnosis not present

## 2020-02-15 DIAGNOSIS — E1165 Type 2 diabetes mellitus with hyperglycemia: Secondary | ICD-10-CM | POA: Diagnosis not present

## 2020-02-15 DIAGNOSIS — Z96653 Presence of artificial knee joint, bilateral: Secondary | ICD-10-CM | POA: Diagnosis not present

## 2020-02-15 DIAGNOSIS — R5381 Other malaise: Secondary | ICD-10-CM | POA: Diagnosis not present

## 2020-02-21 DIAGNOSIS — E1165 Type 2 diabetes mellitus with hyperglycemia: Secondary | ICD-10-CM | POA: Diagnosis not present

## 2020-02-21 DIAGNOSIS — I10 Essential (primary) hypertension: Secondary | ICD-10-CM | POA: Diagnosis not present

## 2020-02-21 DIAGNOSIS — Z299 Encounter for prophylactic measures, unspecified: Secondary | ICD-10-CM | POA: Diagnosis not present

## 2020-02-21 DIAGNOSIS — M545 Low back pain, unspecified: Secondary | ICD-10-CM | POA: Diagnosis not present

## 2020-02-22 ENCOUNTER — Other Ambulatory Visit (HOSPITAL_COMMUNITY): Payer: Self-pay | Admitting: Orthopedic Surgery

## 2020-02-22 ENCOUNTER — Ambulatory Visit (HOSPITAL_COMMUNITY)
Admission: RE | Admit: 2020-02-22 | Discharge: 2020-02-22 | Disposition: A | Discharge status: Home / Self Care | Payer: Medicare PPO | Source: Ambulatory Visit | Attending: Orthopedic Surgery | Admitting: Orthopedic Surgery

## 2020-02-22 ENCOUNTER — Other Ambulatory Visit: Payer: Self-pay

## 2020-02-22 ENCOUNTER — Other Ambulatory Visit: Payer: Self-pay | Admitting: Orthopedic Surgery

## 2020-02-22 DIAGNOSIS — I824Z9 Acute embolism and thrombosis of unspecified deep veins of unspecified distal lower extremity: Secondary | ICD-10-CM

## 2020-02-22 DIAGNOSIS — M79604 Pain in right leg: Secondary | ICD-10-CM | POA: Diagnosis not present

## 2020-02-22 DIAGNOSIS — R600 Localized edema: Secondary | ICD-10-CM | POA: Diagnosis not present

## 2020-02-22 DIAGNOSIS — M79661 Pain in right lower leg: Secondary | ICD-10-CM | POA: Diagnosis not present

## 2020-02-28 DIAGNOSIS — Z9181 History of falling: Secondary | ICD-10-CM | POA: Diagnosis not present

## 2020-02-28 DIAGNOSIS — E1129 Type 2 diabetes mellitus with other diabetic kidney complication: Secondary | ICD-10-CM | POA: Diagnosis not present

## 2020-02-28 DIAGNOSIS — I10 Essential (primary) hypertension: Secondary | ICD-10-CM | POA: Diagnosis not present

## 2020-02-28 DIAGNOSIS — Z96651 Presence of right artificial knee joint: Secondary | ICD-10-CM | POA: Diagnosis not present

## 2020-02-28 DIAGNOSIS — Z299 Encounter for prophylactic measures, unspecified: Secondary | ICD-10-CM | POA: Diagnosis not present

## 2020-02-29 DIAGNOSIS — Z96651 Presence of right artificial knee joint: Secondary | ICD-10-CM | POA: Diagnosis not present

## 2020-03-03 DIAGNOSIS — I10 Essential (primary) hypertension: Secondary | ICD-10-CM | POA: Diagnosis not present

## 2020-03-03 DIAGNOSIS — E119 Type 2 diabetes mellitus without complications: Secondary | ICD-10-CM | POA: Diagnosis not present

## 2020-03-03 DIAGNOSIS — Z87891 Personal history of nicotine dependence: Secondary | ICD-10-CM | POA: Diagnosis not present

## 2020-03-03 DIAGNOSIS — Z96651 Presence of right artificial knee joint: Secondary | ICD-10-CM | POA: Diagnosis not present

## 2020-03-03 DIAGNOSIS — Z471 Aftercare following joint replacement surgery: Secondary | ICD-10-CM | POA: Diagnosis not present

## 2020-03-03 DIAGNOSIS — Z87440 Personal history of urinary (tract) infections: Secondary | ICD-10-CM | POA: Diagnosis not present

## 2020-03-03 DIAGNOSIS — Z7982 Long term (current) use of aspirin: Secondary | ICD-10-CM | POA: Diagnosis not present

## 2020-03-03 DIAGNOSIS — N400 Enlarged prostate without lower urinary tract symptoms: Secondary | ICD-10-CM | POA: Diagnosis not present

## 2020-03-03 DIAGNOSIS — E7800 Pure hypercholesterolemia, unspecified: Secondary | ICD-10-CM | POA: Diagnosis not present

## 2020-03-07 DIAGNOSIS — E119 Type 2 diabetes mellitus without complications: Secondary | ICD-10-CM | POA: Diagnosis not present

## 2020-03-07 DIAGNOSIS — I10 Essential (primary) hypertension: Secondary | ICD-10-CM | POA: Diagnosis not present

## 2020-03-07 DIAGNOSIS — Z87440 Personal history of urinary (tract) infections: Secondary | ICD-10-CM | POA: Diagnosis not present

## 2020-03-07 DIAGNOSIS — N400 Enlarged prostate without lower urinary tract symptoms: Secondary | ICD-10-CM | POA: Diagnosis not present

## 2020-03-07 DIAGNOSIS — Z7982 Long term (current) use of aspirin: Secondary | ICD-10-CM | POA: Diagnosis not present

## 2020-03-07 DIAGNOSIS — Z87891 Personal history of nicotine dependence: Secondary | ICD-10-CM | POA: Diagnosis not present

## 2020-03-07 DIAGNOSIS — Z471 Aftercare following joint replacement surgery: Secondary | ICD-10-CM | POA: Diagnosis not present

## 2020-03-07 DIAGNOSIS — Z96651 Presence of right artificial knee joint: Secondary | ICD-10-CM | POA: Diagnosis not present

## 2020-03-07 DIAGNOSIS — E7800 Pure hypercholesterolemia, unspecified: Secondary | ICD-10-CM | POA: Diagnosis not present

## 2020-03-10 DIAGNOSIS — Z87891 Personal history of nicotine dependence: Secondary | ICD-10-CM | POA: Diagnosis not present

## 2020-03-10 DIAGNOSIS — I10 Essential (primary) hypertension: Secondary | ICD-10-CM | POA: Diagnosis not present

## 2020-03-10 DIAGNOSIS — Z87440 Personal history of urinary (tract) infections: Secondary | ICD-10-CM | POA: Diagnosis not present

## 2020-03-10 DIAGNOSIS — E119 Type 2 diabetes mellitus without complications: Secondary | ICD-10-CM | POA: Diagnosis not present

## 2020-03-10 DIAGNOSIS — Z471 Aftercare following joint replacement surgery: Secondary | ICD-10-CM | POA: Diagnosis not present

## 2020-03-10 DIAGNOSIS — Z96651 Presence of right artificial knee joint: Secondary | ICD-10-CM | POA: Diagnosis not present

## 2020-03-10 DIAGNOSIS — Z7982 Long term (current) use of aspirin: Secondary | ICD-10-CM | POA: Diagnosis not present

## 2020-03-10 DIAGNOSIS — N400 Enlarged prostate without lower urinary tract symptoms: Secondary | ICD-10-CM | POA: Diagnosis not present

## 2020-03-10 DIAGNOSIS — E7800 Pure hypercholesterolemia, unspecified: Secondary | ICD-10-CM | POA: Diagnosis not present

## 2020-03-11 DIAGNOSIS — E1165 Type 2 diabetes mellitus with hyperglycemia: Secondary | ICD-10-CM | POA: Diagnosis not present

## 2020-03-11 DIAGNOSIS — I10 Essential (primary) hypertension: Secondary | ICD-10-CM | POA: Diagnosis not present

## 2020-03-11 DIAGNOSIS — E7800 Pure hypercholesterolemia, unspecified: Secondary | ICD-10-CM | POA: Diagnosis not present

## 2020-03-11 DIAGNOSIS — Z299 Encounter for prophylactic measures, unspecified: Secondary | ICD-10-CM | POA: Diagnosis not present

## 2020-03-13 DIAGNOSIS — Z87891 Personal history of nicotine dependence: Secondary | ICD-10-CM | POA: Diagnosis not present

## 2020-03-13 DIAGNOSIS — E7800 Pure hypercholesterolemia, unspecified: Secondary | ICD-10-CM | POA: Diagnosis not present

## 2020-03-13 DIAGNOSIS — E119 Type 2 diabetes mellitus without complications: Secondary | ICD-10-CM | POA: Diagnosis not present

## 2020-03-13 DIAGNOSIS — I10 Essential (primary) hypertension: Secondary | ICD-10-CM | POA: Diagnosis not present

## 2020-03-13 DIAGNOSIS — N400 Enlarged prostate without lower urinary tract symptoms: Secondary | ICD-10-CM | POA: Diagnosis not present

## 2020-03-13 DIAGNOSIS — Z7982 Long term (current) use of aspirin: Secondary | ICD-10-CM | POA: Diagnosis not present

## 2020-03-13 DIAGNOSIS — Z87440 Personal history of urinary (tract) infections: Secondary | ICD-10-CM | POA: Diagnosis not present

## 2020-03-13 DIAGNOSIS — Z471 Aftercare following joint replacement surgery: Secondary | ICD-10-CM | POA: Diagnosis not present

## 2020-03-13 DIAGNOSIS — Z96651 Presence of right artificial knee joint: Secondary | ICD-10-CM | POA: Diagnosis not present

## 2020-03-15 DIAGNOSIS — E1165 Type 2 diabetes mellitus with hyperglycemia: Secondary | ICD-10-CM | POA: Diagnosis not present

## 2020-03-17 DIAGNOSIS — Z7982 Long term (current) use of aspirin: Secondary | ICD-10-CM | POA: Diagnosis not present

## 2020-03-17 DIAGNOSIS — Z87891 Personal history of nicotine dependence: Secondary | ICD-10-CM | POA: Diagnosis not present

## 2020-03-17 DIAGNOSIS — N400 Enlarged prostate without lower urinary tract symptoms: Secondary | ICD-10-CM | POA: Diagnosis not present

## 2020-03-17 DIAGNOSIS — E7800 Pure hypercholesterolemia, unspecified: Secondary | ICD-10-CM | POA: Diagnosis not present

## 2020-03-17 DIAGNOSIS — Z96651 Presence of right artificial knee joint: Secondary | ICD-10-CM | POA: Diagnosis not present

## 2020-03-17 DIAGNOSIS — Z471 Aftercare following joint replacement surgery: Secondary | ICD-10-CM | POA: Diagnosis not present

## 2020-03-17 DIAGNOSIS — I10 Essential (primary) hypertension: Secondary | ICD-10-CM | POA: Diagnosis not present

## 2020-03-17 DIAGNOSIS — E119 Type 2 diabetes mellitus without complications: Secondary | ICD-10-CM | POA: Diagnosis not present

## 2020-03-17 DIAGNOSIS — Z87440 Personal history of urinary (tract) infections: Secondary | ICD-10-CM | POA: Diagnosis not present

## 2020-03-20 DIAGNOSIS — N400 Enlarged prostate without lower urinary tract symptoms: Secondary | ICD-10-CM | POA: Diagnosis not present

## 2020-03-20 DIAGNOSIS — Z87440 Personal history of urinary (tract) infections: Secondary | ICD-10-CM | POA: Diagnosis not present

## 2020-03-20 DIAGNOSIS — Z96651 Presence of right artificial knee joint: Secondary | ICD-10-CM | POA: Diagnosis not present

## 2020-03-20 DIAGNOSIS — Z87891 Personal history of nicotine dependence: Secondary | ICD-10-CM | POA: Diagnosis not present

## 2020-03-20 DIAGNOSIS — E119 Type 2 diabetes mellitus without complications: Secondary | ICD-10-CM | POA: Diagnosis not present

## 2020-03-20 DIAGNOSIS — Z471 Aftercare following joint replacement surgery: Secondary | ICD-10-CM | POA: Diagnosis not present

## 2020-03-20 DIAGNOSIS — I10 Essential (primary) hypertension: Secondary | ICD-10-CM | POA: Diagnosis not present

## 2020-03-20 DIAGNOSIS — E7800 Pure hypercholesterolemia, unspecified: Secondary | ICD-10-CM | POA: Diagnosis not present

## 2020-03-20 DIAGNOSIS — Z7982 Long term (current) use of aspirin: Secondary | ICD-10-CM | POA: Diagnosis not present

## 2020-03-24 DIAGNOSIS — E7800 Pure hypercholesterolemia, unspecified: Secondary | ICD-10-CM | POA: Diagnosis not present

## 2020-03-24 DIAGNOSIS — Z87891 Personal history of nicotine dependence: Secondary | ICD-10-CM | POA: Diagnosis not present

## 2020-03-24 DIAGNOSIS — Z7982 Long term (current) use of aspirin: Secondary | ICD-10-CM | POA: Diagnosis not present

## 2020-03-24 DIAGNOSIS — Z471 Aftercare following joint replacement surgery: Secondary | ICD-10-CM | POA: Diagnosis not present

## 2020-03-24 DIAGNOSIS — E119 Type 2 diabetes mellitus without complications: Secondary | ICD-10-CM | POA: Diagnosis not present

## 2020-03-24 DIAGNOSIS — I10 Essential (primary) hypertension: Secondary | ICD-10-CM | POA: Diagnosis not present

## 2020-03-24 DIAGNOSIS — N400 Enlarged prostate without lower urinary tract symptoms: Secondary | ICD-10-CM | POA: Diagnosis not present

## 2020-03-24 DIAGNOSIS — Z87440 Personal history of urinary (tract) infections: Secondary | ICD-10-CM | POA: Diagnosis not present

## 2020-03-24 DIAGNOSIS — Z96651 Presence of right artificial knee joint: Secondary | ICD-10-CM | POA: Diagnosis not present

## 2020-03-27 DIAGNOSIS — I10 Essential (primary) hypertension: Secondary | ICD-10-CM | POA: Diagnosis not present

## 2020-03-27 DIAGNOSIS — N400 Enlarged prostate without lower urinary tract symptoms: Secondary | ICD-10-CM | POA: Diagnosis not present

## 2020-03-27 DIAGNOSIS — Z87440 Personal history of urinary (tract) infections: Secondary | ICD-10-CM | POA: Diagnosis not present

## 2020-03-27 DIAGNOSIS — Z96651 Presence of right artificial knee joint: Secondary | ICD-10-CM | POA: Diagnosis not present

## 2020-03-27 DIAGNOSIS — Z7982 Long term (current) use of aspirin: Secondary | ICD-10-CM | POA: Diagnosis not present

## 2020-03-27 DIAGNOSIS — Z471 Aftercare following joint replacement surgery: Secondary | ICD-10-CM | POA: Diagnosis not present

## 2020-03-27 DIAGNOSIS — E7800 Pure hypercholesterolemia, unspecified: Secondary | ICD-10-CM | POA: Diagnosis not present

## 2020-03-27 DIAGNOSIS — E119 Type 2 diabetes mellitus without complications: Secondary | ICD-10-CM | POA: Diagnosis not present

## 2020-03-27 DIAGNOSIS — Z87891 Personal history of nicotine dependence: Secondary | ICD-10-CM | POA: Diagnosis not present

## 2020-04-01 DIAGNOSIS — I10 Essential (primary) hypertension: Secondary | ICD-10-CM | POA: Diagnosis not present

## 2020-04-01 DIAGNOSIS — Z87440 Personal history of urinary (tract) infections: Secondary | ICD-10-CM | POA: Diagnosis not present

## 2020-04-01 DIAGNOSIS — E7800 Pure hypercholesterolemia, unspecified: Secondary | ICD-10-CM | POA: Diagnosis not present

## 2020-04-01 DIAGNOSIS — E119 Type 2 diabetes mellitus without complications: Secondary | ICD-10-CM | POA: Diagnosis not present

## 2020-04-01 DIAGNOSIS — Z471 Aftercare following joint replacement surgery: Secondary | ICD-10-CM | POA: Diagnosis not present

## 2020-04-01 DIAGNOSIS — Z96651 Presence of right artificial knee joint: Secondary | ICD-10-CM | POA: Diagnosis not present

## 2020-04-01 DIAGNOSIS — Z87891 Personal history of nicotine dependence: Secondary | ICD-10-CM | POA: Diagnosis not present

## 2020-04-01 DIAGNOSIS — N400 Enlarged prostate without lower urinary tract symptoms: Secondary | ICD-10-CM | POA: Diagnosis not present

## 2020-04-01 DIAGNOSIS — Z7982 Long term (current) use of aspirin: Secondary | ICD-10-CM | POA: Diagnosis not present

## 2020-04-02 DIAGNOSIS — I10 Essential (primary) hypertension: Secondary | ICD-10-CM | POA: Diagnosis not present

## 2020-04-02 DIAGNOSIS — Z87891 Personal history of nicotine dependence: Secondary | ICD-10-CM | POA: Diagnosis not present

## 2020-04-02 DIAGNOSIS — E119 Type 2 diabetes mellitus without complications: Secondary | ICD-10-CM | POA: Diagnosis not present

## 2020-04-02 DIAGNOSIS — Z96651 Presence of right artificial knee joint: Secondary | ICD-10-CM | POA: Diagnosis not present

## 2020-04-02 DIAGNOSIS — Z87440 Personal history of urinary (tract) infections: Secondary | ICD-10-CM | POA: Diagnosis not present

## 2020-04-02 DIAGNOSIS — E7800 Pure hypercholesterolemia, unspecified: Secondary | ICD-10-CM | POA: Diagnosis not present

## 2020-04-02 DIAGNOSIS — Z7982 Long term (current) use of aspirin: Secondary | ICD-10-CM | POA: Diagnosis not present

## 2020-04-02 DIAGNOSIS — Z471 Aftercare following joint replacement surgery: Secondary | ICD-10-CM | POA: Diagnosis not present

## 2020-04-02 DIAGNOSIS — N400 Enlarged prostate without lower urinary tract symptoms: Secondary | ICD-10-CM | POA: Diagnosis not present

## 2020-04-03 DIAGNOSIS — Z96651 Presence of right artificial knee joint: Secondary | ICD-10-CM | POA: Diagnosis not present

## 2020-04-09 DIAGNOSIS — Z96651 Presence of right artificial knee joint: Secondary | ICD-10-CM | POA: Diagnosis not present

## 2020-04-09 DIAGNOSIS — E7800 Pure hypercholesterolemia, unspecified: Secondary | ICD-10-CM | POA: Diagnosis not present

## 2020-04-09 DIAGNOSIS — N400 Enlarged prostate without lower urinary tract symptoms: Secondary | ICD-10-CM | POA: Diagnosis not present

## 2020-04-09 DIAGNOSIS — Z87891 Personal history of nicotine dependence: Secondary | ICD-10-CM | POA: Diagnosis not present

## 2020-04-09 DIAGNOSIS — Z7982 Long term (current) use of aspirin: Secondary | ICD-10-CM | POA: Diagnosis not present

## 2020-04-09 DIAGNOSIS — Z471 Aftercare following joint replacement surgery: Secondary | ICD-10-CM | POA: Diagnosis not present

## 2020-04-09 DIAGNOSIS — E119 Type 2 diabetes mellitus without complications: Secondary | ICD-10-CM | POA: Diagnosis not present

## 2020-04-09 DIAGNOSIS — Z87440 Personal history of urinary (tract) infections: Secondary | ICD-10-CM | POA: Diagnosis not present

## 2020-04-09 DIAGNOSIS — I10 Essential (primary) hypertension: Secondary | ICD-10-CM | POA: Diagnosis not present

## 2020-04-15 DIAGNOSIS — E1165 Type 2 diabetes mellitus with hyperglycemia: Secondary | ICD-10-CM | POA: Diagnosis not present

## 2020-04-16 DIAGNOSIS — Z87440 Personal history of urinary (tract) infections: Secondary | ICD-10-CM | POA: Diagnosis not present

## 2020-04-16 DIAGNOSIS — E119 Type 2 diabetes mellitus without complications: Secondary | ICD-10-CM | POA: Diagnosis not present

## 2020-04-16 DIAGNOSIS — Z96651 Presence of right artificial knee joint: Secondary | ICD-10-CM | POA: Diagnosis not present

## 2020-04-16 DIAGNOSIS — Z87891 Personal history of nicotine dependence: Secondary | ICD-10-CM | POA: Diagnosis not present

## 2020-04-16 DIAGNOSIS — I10 Essential (primary) hypertension: Secondary | ICD-10-CM | POA: Diagnosis not present

## 2020-04-16 DIAGNOSIS — Z471 Aftercare following joint replacement surgery: Secondary | ICD-10-CM | POA: Diagnosis not present

## 2020-04-16 DIAGNOSIS — N400 Enlarged prostate without lower urinary tract symptoms: Secondary | ICD-10-CM | POA: Diagnosis not present

## 2020-04-16 DIAGNOSIS — Z7982 Long term (current) use of aspirin: Secondary | ICD-10-CM | POA: Diagnosis not present

## 2020-04-16 DIAGNOSIS — E7800 Pure hypercholesterolemia, unspecified: Secondary | ICD-10-CM | POA: Diagnosis not present

## 2020-04-23 DIAGNOSIS — N400 Enlarged prostate without lower urinary tract symptoms: Secondary | ICD-10-CM | POA: Diagnosis not present

## 2020-04-23 DIAGNOSIS — Z471 Aftercare following joint replacement surgery: Secondary | ICD-10-CM | POA: Diagnosis not present

## 2020-04-23 DIAGNOSIS — I10 Essential (primary) hypertension: Secondary | ICD-10-CM | POA: Diagnosis not present

## 2020-04-23 DIAGNOSIS — Z7982 Long term (current) use of aspirin: Secondary | ICD-10-CM | POA: Diagnosis not present

## 2020-04-23 DIAGNOSIS — E7800 Pure hypercholesterolemia, unspecified: Secondary | ICD-10-CM | POA: Diagnosis not present

## 2020-04-23 DIAGNOSIS — Z87440 Personal history of urinary (tract) infections: Secondary | ICD-10-CM | POA: Diagnosis not present

## 2020-04-23 DIAGNOSIS — E119 Type 2 diabetes mellitus without complications: Secondary | ICD-10-CM | POA: Diagnosis not present

## 2020-04-23 DIAGNOSIS — Z96651 Presence of right artificial knee joint: Secondary | ICD-10-CM | POA: Diagnosis not present

## 2020-04-23 DIAGNOSIS — Z87891 Personal history of nicotine dependence: Secondary | ICD-10-CM | POA: Diagnosis not present

## 2020-04-24 DIAGNOSIS — M25661 Stiffness of right knee, not elsewhere classified: Secondary | ICD-10-CM | POA: Diagnosis not present

## 2020-04-24 DIAGNOSIS — R262 Difficulty in walking, not elsewhere classified: Secondary | ICD-10-CM | POA: Diagnosis not present

## 2020-04-24 DIAGNOSIS — Z96651 Presence of right artificial knee joint: Secondary | ICD-10-CM | POA: Diagnosis not present

## 2020-04-25 DIAGNOSIS — Z96651 Presence of right artificial knee joint: Secondary | ICD-10-CM | POA: Diagnosis not present

## 2020-04-25 DIAGNOSIS — M25661 Stiffness of right knee, not elsewhere classified: Secondary | ICD-10-CM | POA: Diagnosis not present

## 2020-04-25 DIAGNOSIS — R262 Difficulty in walking, not elsewhere classified: Secondary | ICD-10-CM | POA: Diagnosis not present

## 2020-04-29 DIAGNOSIS — R262 Difficulty in walking, not elsewhere classified: Secondary | ICD-10-CM | POA: Diagnosis not present

## 2020-04-29 DIAGNOSIS — Z96651 Presence of right artificial knee joint: Secondary | ICD-10-CM | POA: Diagnosis not present

## 2020-04-29 DIAGNOSIS — M25661 Stiffness of right knee, not elsewhere classified: Secondary | ICD-10-CM | POA: Diagnosis not present

## 2020-05-01 DIAGNOSIS — Z96651 Presence of right artificial knee joint: Secondary | ICD-10-CM | POA: Diagnosis not present

## 2020-05-06 DIAGNOSIS — R262 Difficulty in walking, not elsewhere classified: Secondary | ICD-10-CM | POA: Diagnosis not present

## 2020-05-06 DIAGNOSIS — M25661 Stiffness of right knee, not elsewhere classified: Secondary | ICD-10-CM | POA: Diagnosis not present

## 2020-05-06 DIAGNOSIS — Z96651 Presence of right artificial knee joint: Secondary | ICD-10-CM | POA: Diagnosis not present

## 2020-05-08 DIAGNOSIS — Z96651 Presence of right artificial knee joint: Secondary | ICD-10-CM | POA: Diagnosis not present

## 2020-05-08 DIAGNOSIS — M25661 Stiffness of right knee, not elsewhere classified: Secondary | ICD-10-CM | POA: Diagnosis not present

## 2020-05-08 DIAGNOSIS — R262 Difficulty in walking, not elsewhere classified: Secondary | ICD-10-CM | POA: Diagnosis not present

## 2020-05-14 DIAGNOSIS — Z96651 Presence of right artificial knee joint: Secondary | ICD-10-CM | POA: Diagnosis not present

## 2020-05-14 DIAGNOSIS — R262 Difficulty in walking, not elsewhere classified: Secondary | ICD-10-CM | POA: Diagnosis not present

## 2020-05-14 DIAGNOSIS — M25661 Stiffness of right knee, not elsewhere classified: Secondary | ICD-10-CM | POA: Diagnosis not present

## 2020-05-15 DIAGNOSIS — E1165 Type 2 diabetes mellitus with hyperglycemia: Secondary | ICD-10-CM | POA: Diagnosis not present

## 2020-05-16 DIAGNOSIS — E1165 Type 2 diabetes mellitus with hyperglycemia: Secondary | ICD-10-CM | POA: Diagnosis not present

## 2020-05-16 DIAGNOSIS — I10 Essential (primary) hypertension: Secondary | ICD-10-CM | POA: Diagnosis not present

## 2020-05-16 DIAGNOSIS — E7849 Other hyperlipidemia: Secondary | ICD-10-CM | POA: Diagnosis not present

## 2020-05-21 DIAGNOSIS — M25661 Stiffness of right knee, not elsewhere classified: Secondary | ICD-10-CM | POA: Diagnosis not present

## 2020-05-21 DIAGNOSIS — Z96651 Presence of right artificial knee joint: Secondary | ICD-10-CM | POA: Diagnosis not present

## 2020-05-21 DIAGNOSIS — R262 Difficulty in walking, not elsewhere classified: Secondary | ICD-10-CM | POA: Diagnosis not present

## 2020-05-28 DIAGNOSIS — M25661 Stiffness of right knee, not elsewhere classified: Secondary | ICD-10-CM | POA: Diagnosis not present

## 2020-05-28 DIAGNOSIS — H43393 Other vitreous opacities, bilateral: Secondary | ICD-10-CM | POA: Diagnosis not present

## 2020-05-28 DIAGNOSIS — Z96651 Presence of right artificial knee joint: Secondary | ICD-10-CM | POA: Diagnosis not present

## 2020-05-28 DIAGNOSIS — R262 Difficulty in walking, not elsewhere classified: Secondary | ICD-10-CM | POA: Diagnosis not present

## 2020-06-11 DIAGNOSIS — Z6826 Body mass index (BMI) 26.0-26.9, adult: Secondary | ICD-10-CM | POA: Diagnosis not present

## 2020-06-11 DIAGNOSIS — Z789 Other specified health status: Secondary | ICD-10-CM | POA: Diagnosis not present

## 2020-06-11 DIAGNOSIS — I10 Essential (primary) hypertension: Secondary | ICD-10-CM | POA: Diagnosis not present

## 2020-06-11 DIAGNOSIS — Z299 Encounter for prophylactic measures, unspecified: Secondary | ICD-10-CM | POA: Diagnosis not present

## 2020-06-11 DIAGNOSIS — E1165 Type 2 diabetes mellitus with hyperglycemia: Secondary | ICD-10-CM | POA: Diagnosis not present

## 2020-06-12 DIAGNOSIS — M25661 Stiffness of right knee, not elsewhere classified: Secondary | ICD-10-CM | POA: Diagnosis not present

## 2020-06-12 DIAGNOSIS — R262 Difficulty in walking, not elsewhere classified: Secondary | ICD-10-CM | POA: Diagnosis not present

## 2020-06-12 DIAGNOSIS — Z96651 Presence of right artificial knee joint: Secondary | ICD-10-CM | POA: Diagnosis not present

## 2020-06-16 DIAGNOSIS — E1165 Type 2 diabetes mellitus with hyperglycemia: Secondary | ICD-10-CM | POA: Diagnosis not present

## 2020-06-19 DIAGNOSIS — Z96651 Presence of right artificial knee joint: Secondary | ICD-10-CM | POA: Diagnosis not present

## 2020-06-19 DIAGNOSIS — M25661 Stiffness of right knee, not elsewhere classified: Secondary | ICD-10-CM | POA: Diagnosis not present

## 2020-06-19 DIAGNOSIS — R262 Difficulty in walking, not elsewhere classified: Secondary | ICD-10-CM | POA: Diagnosis not present

## 2020-06-24 DIAGNOSIS — R030 Elevated blood-pressure reading, without diagnosis of hypertension: Secondary | ICD-10-CM | POA: Diagnosis not present

## 2020-06-24 DIAGNOSIS — Z6826 Body mass index (BMI) 26.0-26.9, adult: Secondary | ICD-10-CM | POA: Diagnosis not present

## 2020-06-24 DIAGNOSIS — Z299 Encounter for prophylactic measures, unspecified: Secondary | ICD-10-CM | POA: Diagnosis not present

## 2020-06-24 DIAGNOSIS — I10 Essential (primary) hypertension: Secondary | ICD-10-CM | POA: Diagnosis not present

## 2020-06-24 DIAGNOSIS — E1129 Type 2 diabetes mellitus with other diabetic kidney complication: Secondary | ICD-10-CM | POA: Diagnosis not present

## 2020-06-24 DIAGNOSIS — E1165 Type 2 diabetes mellitus with hyperglycemia: Secondary | ICD-10-CM | POA: Diagnosis not present

## 2020-06-24 DIAGNOSIS — Z789 Other specified health status: Secondary | ICD-10-CM | POA: Diagnosis not present

## 2020-07-02 DIAGNOSIS — R262 Difficulty in walking, not elsewhere classified: Secondary | ICD-10-CM | POA: Diagnosis not present

## 2020-07-02 DIAGNOSIS — M25661 Stiffness of right knee, not elsewhere classified: Secondary | ICD-10-CM | POA: Diagnosis not present

## 2020-07-02 DIAGNOSIS — Z96651 Presence of right artificial knee joint: Secondary | ICD-10-CM | POA: Diagnosis not present

## 2020-07-10 DIAGNOSIS — Z96651 Presence of right artificial knee joint: Secondary | ICD-10-CM | POA: Diagnosis not present

## 2020-07-10 DIAGNOSIS — R262 Difficulty in walking, not elsewhere classified: Secondary | ICD-10-CM | POA: Diagnosis not present

## 2020-07-10 DIAGNOSIS — M25661 Stiffness of right knee, not elsewhere classified: Secondary | ICD-10-CM | POA: Diagnosis not present

## 2020-07-14 DIAGNOSIS — E7849 Other hyperlipidemia: Secondary | ICD-10-CM | POA: Diagnosis not present

## 2020-07-14 DIAGNOSIS — I10 Essential (primary) hypertension: Secondary | ICD-10-CM | POA: Diagnosis not present

## 2020-07-14 DIAGNOSIS — E1165 Type 2 diabetes mellitus with hyperglycemia: Secondary | ICD-10-CM | POA: Diagnosis not present

## 2020-07-16 DIAGNOSIS — Z96651 Presence of right artificial knee joint: Secondary | ICD-10-CM | POA: Diagnosis not present

## 2020-07-16 DIAGNOSIS — R262 Difficulty in walking, not elsewhere classified: Secondary | ICD-10-CM | POA: Diagnosis not present

## 2020-07-16 DIAGNOSIS — M25661 Stiffness of right knee, not elsewhere classified: Secondary | ICD-10-CM | POA: Diagnosis not present

## 2020-07-23 DIAGNOSIS — Z713 Dietary counseling and surveillance: Secondary | ICD-10-CM | POA: Diagnosis not present

## 2020-07-23 DIAGNOSIS — Z6826 Body mass index (BMI) 26.0-26.9, adult: Secondary | ICD-10-CM | POA: Diagnosis not present

## 2020-07-23 DIAGNOSIS — I10 Essential (primary) hypertension: Secondary | ICD-10-CM | POA: Diagnosis not present

## 2020-07-23 DIAGNOSIS — E1165 Type 2 diabetes mellitus with hyperglycemia: Secondary | ICD-10-CM | POA: Diagnosis not present

## 2020-07-23 DIAGNOSIS — Z299 Encounter for prophylactic measures, unspecified: Secondary | ICD-10-CM | POA: Diagnosis not present

## 2020-07-31 DIAGNOSIS — Z96651 Presence of right artificial knee joint: Secondary | ICD-10-CM | POA: Diagnosis not present

## 2020-08-13 DIAGNOSIS — I10 Essential (primary) hypertension: Secondary | ICD-10-CM | POA: Diagnosis not present

## 2020-08-13 DIAGNOSIS — E7849 Other hyperlipidemia: Secondary | ICD-10-CM | POA: Diagnosis not present

## 2020-08-14 DIAGNOSIS — E1165 Type 2 diabetes mellitus with hyperglycemia: Secondary | ICD-10-CM | POA: Diagnosis not present

## 2020-09-12 DIAGNOSIS — I10 Essential (primary) hypertension: Secondary | ICD-10-CM | POA: Diagnosis not present

## 2020-09-12 DIAGNOSIS — E1165 Type 2 diabetes mellitus with hyperglycemia: Secondary | ICD-10-CM | POA: Diagnosis not present

## 2020-09-12 DIAGNOSIS — E1129 Type 2 diabetes mellitus with other diabetic kidney complication: Secondary | ICD-10-CM | POA: Diagnosis not present

## 2020-09-12 DIAGNOSIS — N400 Enlarged prostate without lower urinary tract symptoms: Secondary | ICD-10-CM | POA: Diagnosis not present

## 2020-09-12 DIAGNOSIS — Z299 Encounter for prophylactic measures, unspecified: Secondary | ICD-10-CM | POA: Diagnosis not present

## 2020-09-12 DIAGNOSIS — Z6826 Body mass index (BMI) 26.0-26.9, adult: Secondary | ICD-10-CM | POA: Diagnosis not present

## 2020-09-13 DIAGNOSIS — F79 Unspecified intellectual disabilities: Secondary | ICD-10-CM | POA: Diagnosis not present

## 2020-09-13 DIAGNOSIS — E7849 Other hyperlipidemia: Secondary | ICD-10-CM | POA: Diagnosis not present

## 2020-10-14 DIAGNOSIS — E1165 Type 2 diabetes mellitus with hyperglycemia: Secondary | ICD-10-CM | POA: Diagnosis not present

## 2020-11-13 DIAGNOSIS — F79 Unspecified intellectual disabilities: Secondary | ICD-10-CM | POA: Diagnosis not present

## 2020-11-13 DIAGNOSIS — E7849 Other hyperlipidemia: Secondary | ICD-10-CM | POA: Diagnosis not present

## 2020-11-13 DIAGNOSIS — E1165 Type 2 diabetes mellitus with hyperglycemia: Secondary | ICD-10-CM | POA: Diagnosis not present

## 2020-12-12 DIAGNOSIS — E1165 Type 2 diabetes mellitus with hyperglycemia: Secondary | ICD-10-CM | POA: Diagnosis not present

## 2020-12-14 DIAGNOSIS — F79 Unspecified intellectual disabilities: Secondary | ICD-10-CM | POA: Diagnosis not present

## 2020-12-14 DIAGNOSIS — E7849 Other hyperlipidemia: Secondary | ICD-10-CM | POA: Diagnosis not present

## 2020-12-18 DIAGNOSIS — Z299 Encounter for prophylactic measures, unspecified: Secondary | ICD-10-CM | POA: Diagnosis not present

## 2020-12-18 DIAGNOSIS — E1129 Type 2 diabetes mellitus with other diabetic kidney complication: Secondary | ICD-10-CM | POA: Diagnosis not present

## 2020-12-18 DIAGNOSIS — I10 Essential (primary) hypertension: Secondary | ICD-10-CM | POA: Diagnosis not present

## 2020-12-18 DIAGNOSIS — E1165 Type 2 diabetes mellitus with hyperglycemia: Secondary | ICD-10-CM | POA: Diagnosis not present

## 2021-01-13 DIAGNOSIS — Z299 Encounter for prophylactic measures, unspecified: Secondary | ICD-10-CM | POA: Diagnosis not present

## 2021-01-13 DIAGNOSIS — I10 Essential (primary) hypertension: Secondary | ICD-10-CM | POA: Diagnosis not present

## 2021-01-13 DIAGNOSIS — E7800 Pure hypercholesterolemia, unspecified: Secondary | ICD-10-CM | POA: Diagnosis not present

## 2021-01-13 DIAGNOSIS — J309 Allergic rhinitis, unspecified: Secondary | ICD-10-CM | POA: Diagnosis not present

## 2021-01-14 DIAGNOSIS — E7849 Other hyperlipidemia: Secondary | ICD-10-CM | POA: Diagnosis not present

## 2021-01-14 DIAGNOSIS — F79 Unspecified intellectual disabilities: Secondary | ICD-10-CM | POA: Diagnosis not present

## 2021-01-14 DIAGNOSIS — E1165 Type 2 diabetes mellitus with hyperglycemia: Secondary | ICD-10-CM | POA: Diagnosis not present

## 2021-02-02 DIAGNOSIS — Z79899 Other long term (current) drug therapy: Secondary | ICD-10-CM | POA: Diagnosis not present

## 2021-02-02 DIAGNOSIS — Z7189 Other specified counseling: Secondary | ICD-10-CM | POA: Diagnosis not present

## 2021-02-02 DIAGNOSIS — Z0000 Encounter for general adult medical examination without abnormal findings: Secondary | ICD-10-CM | POA: Diagnosis not present

## 2021-02-02 DIAGNOSIS — Z299 Encounter for prophylactic measures, unspecified: Secondary | ICD-10-CM | POA: Diagnosis not present

## 2021-02-02 DIAGNOSIS — M79605 Pain in left leg: Secondary | ICD-10-CM | POA: Diagnosis not present

## 2021-02-02 DIAGNOSIS — M25661 Stiffness of right knee, not elsewhere classified: Secondary | ICD-10-CM | POA: Diagnosis not present

## 2021-02-02 DIAGNOSIS — Z1331 Encounter for screening for depression: Secondary | ICD-10-CM | POA: Diagnosis not present

## 2021-02-02 DIAGNOSIS — I10 Essential (primary) hypertension: Secondary | ICD-10-CM | POA: Diagnosis not present

## 2021-02-02 DIAGNOSIS — Z23 Encounter for immunization: Secondary | ICD-10-CM | POA: Diagnosis not present

## 2021-02-02 DIAGNOSIS — Z1339 Encounter for screening examination for other mental health and behavioral disorders: Secondary | ICD-10-CM | POA: Diagnosis not present

## 2021-02-02 DIAGNOSIS — R5383 Other fatigue: Secondary | ICD-10-CM | POA: Diagnosis not present

## 2021-02-02 DIAGNOSIS — Z6825 Body mass index (BMI) 25.0-25.9, adult: Secondary | ICD-10-CM | POA: Diagnosis not present

## 2021-02-02 DIAGNOSIS — Z136 Encounter for screening for cardiovascular disorders: Secondary | ICD-10-CM | POA: Diagnosis not present

## 2021-02-02 DIAGNOSIS — S7012XA Contusion of left thigh, initial encounter: Secondary | ICD-10-CM | POA: Diagnosis not present

## 2021-02-02 DIAGNOSIS — Z125 Encounter for screening for malignant neoplasm of prostate: Secondary | ICD-10-CM | POA: Diagnosis not present

## 2021-02-02 DIAGNOSIS — E7800 Pure hypercholesterolemia, unspecified: Secondary | ICD-10-CM | POA: Diagnosis not present

## 2021-02-02 DIAGNOSIS — Z96651 Presence of right artificial knee joint: Secondary | ICD-10-CM | POA: Diagnosis not present

## 2021-02-13 DIAGNOSIS — E1165 Type 2 diabetes mellitus with hyperglycemia: Secondary | ICD-10-CM | POA: Diagnosis not present

## 2021-03-16 DIAGNOSIS — E7849 Other hyperlipidemia: Secondary | ICD-10-CM | POA: Diagnosis not present

## 2021-03-16 DIAGNOSIS — N400 Enlarged prostate without lower urinary tract symptoms: Secondary | ICD-10-CM | POA: Diagnosis not present

## 2021-03-16 DIAGNOSIS — E1165 Type 2 diabetes mellitus with hyperglycemia: Secondary | ICD-10-CM | POA: Diagnosis not present

## 2021-03-16 DIAGNOSIS — M109 Gout, unspecified: Secondary | ICD-10-CM | POA: Diagnosis not present

## 2021-03-26 DIAGNOSIS — I10 Essential (primary) hypertension: Secondary | ICD-10-CM | POA: Diagnosis not present

## 2021-03-26 DIAGNOSIS — Z713 Dietary counseling and surveillance: Secondary | ICD-10-CM | POA: Diagnosis not present

## 2021-03-26 DIAGNOSIS — Z299 Encounter for prophylactic measures, unspecified: Secondary | ICD-10-CM | POA: Diagnosis not present

## 2021-03-26 DIAGNOSIS — R5383 Other fatigue: Secondary | ICD-10-CM | POA: Diagnosis not present

## 2021-03-26 DIAGNOSIS — E1165 Type 2 diabetes mellitus with hyperglycemia: Secondary | ICD-10-CM | POA: Diagnosis not present

## 2021-04-15 DIAGNOSIS — E7800 Pure hypercholesterolemia, unspecified: Secondary | ICD-10-CM | POA: Diagnosis not present

## 2021-04-15 DIAGNOSIS — I10 Essential (primary) hypertension: Secondary | ICD-10-CM | POA: Diagnosis not present

## 2021-04-15 DIAGNOSIS — E1165 Type 2 diabetes mellitus with hyperglycemia: Secondary | ICD-10-CM | POA: Diagnosis not present

## 2021-05-15 DIAGNOSIS — E1165 Type 2 diabetes mellitus with hyperglycemia: Secondary | ICD-10-CM | POA: Diagnosis not present

## 2021-06-14 DIAGNOSIS — E1165 Type 2 diabetes mellitus with hyperglycemia: Secondary | ICD-10-CM | POA: Diagnosis not present

## 2021-07-01 DIAGNOSIS — Z299 Encounter for prophylactic measures, unspecified: Secondary | ICD-10-CM | POA: Diagnosis not present

## 2021-07-01 DIAGNOSIS — I10 Essential (primary) hypertension: Secondary | ICD-10-CM | POA: Diagnosis not present

## 2021-07-01 DIAGNOSIS — Z6825 Body mass index (BMI) 25.0-25.9, adult: Secondary | ICD-10-CM | POA: Diagnosis not present

## 2021-07-01 DIAGNOSIS — E1129 Type 2 diabetes mellitus with other diabetic kidney complication: Secondary | ICD-10-CM | POA: Diagnosis not present

## 2021-07-01 DIAGNOSIS — E1165 Type 2 diabetes mellitus with hyperglycemia: Secondary | ICD-10-CM | POA: Diagnosis not present

## 2021-07-14 DIAGNOSIS — E1165 Type 2 diabetes mellitus with hyperglycemia: Secondary | ICD-10-CM | POA: Diagnosis not present

## 2021-07-30 DIAGNOSIS — Z6825 Body mass index (BMI) 25.0-25.9, adult: Secondary | ICD-10-CM | POA: Diagnosis not present

## 2021-07-30 DIAGNOSIS — Z299 Encounter for prophylactic measures, unspecified: Secondary | ICD-10-CM | POA: Diagnosis not present

## 2021-07-30 DIAGNOSIS — E1165 Type 2 diabetes mellitus with hyperglycemia: Secondary | ICD-10-CM | POA: Diagnosis not present

## 2021-07-30 DIAGNOSIS — I10 Essential (primary) hypertension: Secondary | ICD-10-CM | POA: Diagnosis not present

## 2021-07-30 DIAGNOSIS — Z713 Dietary counseling and surveillance: Secondary | ICD-10-CM | POA: Diagnosis not present

## 2021-08-13 DIAGNOSIS — E1165 Type 2 diabetes mellitus with hyperglycemia: Secondary | ICD-10-CM | POA: Diagnosis not present

## 2021-08-29 DIAGNOSIS — M109 Gout, unspecified: Secondary | ICD-10-CM | POA: Diagnosis not present

## 2021-08-29 DIAGNOSIS — S42292A Other displaced fracture of upper end of left humerus, initial encounter for closed fracture: Secondary | ICD-10-CM | POA: Diagnosis not present

## 2021-08-29 DIAGNOSIS — W010XXA Fall on same level from slipping, tripping and stumbling without subsequent striking against object, initial encounter: Secondary | ICD-10-CM | POA: Diagnosis not present

## 2021-08-29 DIAGNOSIS — S42202A Unspecified fracture of upper end of left humerus, initial encounter for closed fracture: Secondary | ICD-10-CM | POA: Diagnosis not present

## 2021-08-29 DIAGNOSIS — M19012 Primary osteoarthritis, left shoulder: Secondary | ICD-10-CM | POA: Diagnosis not present

## 2021-08-29 DIAGNOSIS — E119 Type 2 diabetes mellitus without complications: Secondary | ICD-10-CM | POA: Diagnosis not present

## 2021-08-29 DIAGNOSIS — I10 Essential (primary) hypertension: Secondary | ICD-10-CM | POA: Diagnosis not present

## 2021-09-01 DIAGNOSIS — S42292A Other displaced fracture of upper end of left humerus, initial encounter for closed fracture: Secondary | ICD-10-CM | POA: Diagnosis not present

## 2021-09-01 DIAGNOSIS — Z299 Encounter for prophylactic measures, unspecified: Secondary | ICD-10-CM | POA: Diagnosis not present

## 2021-09-01 DIAGNOSIS — Z87891 Personal history of nicotine dependence: Secondary | ICD-10-CM | POA: Diagnosis not present

## 2021-09-01 DIAGNOSIS — S4292XA Fracture of left shoulder girdle, part unspecified, initial encounter for closed fracture: Secondary | ICD-10-CM | POA: Diagnosis not present

## 2021-09-01 DIAGNOSIS — I10 Essential (primary) hypertension: Secondary | ICD-10-CM | POA: Diagnosis not present

## 2021-09-07 DIAGNOSIS — I700 Atherosclerosis of aorta: Secondary | ICD-10-CM | POA: Diagnosis not present

## 2021-09-07 DIAGNOSIS — S42142A Displaced fracture of glenoid cavity of scapula, left shoulder, initial encounter for closed fracture: Secondary | ICD-10-CM | POA: Diagnosis not present

## 2021-09-07 DIAGNOSIS — S42292A Other displaced fracture of upper end of left humerus, initial encounter for closed fracture: Secondary | ICD-10-CM | POA: Diagnosis not present

## 2021-09-07 DIAGNOSIS — M19012 Primary osteoarthritis, left shoulder: Secondary | ICD-10-CM | POA: Diagnosis not present

## 2021-09-09 DIAGNOSIS — W010XXD Fall on same level from slipping, tripping and stumbling without subsequent striking against object, subsequent encounter: Secondary | ICD-10-CM | POA: Diagnosis not present

## 2021-09-09 DIAGNOSIS — S42292D Other displaced fracture of upper end of left humerus, subsequent encounter for fracture with routine healing: Secondary | ICD-10-CM | POA: Diagnosis not present

## 2021-09-13 DIAGNOSIS — R2681 Unsteadiness on feet: Secondary | ICD-10-CM | POA: Diagnosis not present

## 2021-09-13 DIAGNOSIS — E7800 Pure hypercholesterolemia, unspecified: Secondary | ICD-10-CM | POA: Diagnosis not present

## 2021-09-13 DIAGNOSIS — E1165 Type 2 diabetes mellitus with hyperglycemia: Secondary | ICD-10-CM | POA: Diagnosis not present

## 2021-09-16 DIAGNOSIS — E1129 Type 2 diabetes mellitus with other diabetic kidney complication: Secondary | ICD-10-CM | POA: Diagnosis not present

## 2021-09-16 DIAGNOSIS — I10 Essential (primary) hypertension: Secondary | ICD-10-CM | POA: Diagnosis not present

## 2021-09-16 DIAGNOSIS — S4292XD Fracture of left shoulder girdle, part unspecified, subsequent encounter for fracture with routine healing: Secondary | ICD-10-CM | POA: Diagnosis not present

## 2021-09-16 DIAGNOSIS — M109 Gout, unspecified: Secondary | ICD-10-CM | POA: Diagnosis not present

## 2021-09-24 DIAGNOSIS — S42202D Unspecified fracture of upper end of left humerus, subsequent encounter for fracture with routine healing: Secondary | ICD-10-CM | POA: Diagnosis not present

## 2021-09-28 DIAGNOSIS — Z6824 Body mass index (BMI) 24.0-24.9, adult: Secondary | ICD-10-CM | POA: Diagnosis not present

## 2021-09-28 DIAGNOSIS — Z299 Encounter for prophylactic measures, unspecified: Secondary | ICD-10-CM | POA: Diagnosis not present

## 2021-09-28 DIAGNOSIS — I10 Essential (primary) hypertension: Secondary | ICD-10-CM | POA: Diagnosis not present

## 2021-09-28 DIAGNOSIS — E1165 Type 2 diabetes mellitus with hyperglycemia: Secondary | ICD-10-CM | POA: Diagnosis not present

## 2021-09-28 DIAGNOSIS — Z0000 Encounter for general adult medical examination without abnormal findings: Secondary | ICD-10-CM | POA: Diagnosis not present

## 2021-10-13 DIAGNOSIS — E1165 Type 2 diabetes mellitus with hyperglycemia: Secondary | ICD-10-CM | POA: Diagnosis not present

## 2021-10-29 DIAGNOSIS — S42202D Unspecified fracture of upper end of left humerus, subsequent encounter for fracture with routine healing: Secondary | ICD-10-CM | POA: Diagnosis not present

## 2021-11-12 DIAGNOSIS — E1165 Type 2 diabetes mellitus with hyperglycemia: Secondary | ICD-10-CM | POA: Diagnosis not present

## 2021-11-13 DIAGNOSIS — F79 Unspecified intellectual disabilities: Secondary | ICD-10-CM | POA: Diagnosis not present

## 2021-11-13 DIAGNOSIS — E7849 Other hyperlipidemia: Secondary | ICD-10-CM | POA: Diagnosis not present

## 2021-11-16 DIAGNOSIS — I10 Essential (primary) hypertension: Secondary | ICD-10-CM | POA: Diagnosis not present

## 2021-11-16 DIAGNOSIS — E1165 Type 2 diabetes mellitus with hyperglycemia: Secondary | ICD-10-CM | POA: Diagnosis not present

## 2021-11-16 DIAGNOSIS — Z299 Encounter for prophylactic measures, unspecified: Secondary | ICD-10-CM | POA: Diagnosis not present

## 2021-12-14 DIAGNOSIS — F79 Unspecified intellectual disabilities: Secondary | ICD-10-CM | POA: Diagnosis not present

## 2021-12-14 DIAGNOSIS — E1165 Type 2 diabetes mellitus with hyperglycemia: Secondary | ICD-10-CM | POA: Diagnosis not present

## 2021-12-14 DIAGNOSIS — E7849 Other hyperlipidemia: Secondary | ICD-10-CM | POA: Diagnosis not present

## 2022-01-08 DIAGNOSIS — U071 COVID-19: Secondary | ICD-10-CM | POA: Diagnosis not present

## 2022-01-08 DIAGNOSIS — I10 Essential (primary) hypertension: Secondary | ICD-10-CM | POA: Diagnosis not present

## 2022-01-08 DIAGNOSIS — Z299 Encounter for prophylactic measures, unspecified: Secondary | ICD-10-CM | POA: Diagnosis not present

## 2022-01-08 DIAGNOSIS — R059 Cough, unspecified: Secondary | ICD-10-CM | POA: Diagnosis not present

## 2022-01-13 DIAGNOSIS — E1165 Type 2 diabetes mellitus with hyperglycemia: Secondary | ICD-10-CM | POA: Diagnosis not present

## 2022-02-05 DIAGNOSIS — Z23 Encounter for immunization: Secondary | ICD-10-CM | POA: Diagnosis not present

## 2022-02-05 DIAGNOSIS — I10 Essential (primary) hypertension: Secondary | ICD-10-CM | POA: Diagnosis not present

## 2022-02-05 DIAGNOSIS — Z0000 Encounter for general adult medical examination without abnormal findings: Secondary | ICD-10-CM | POA: Diagnosis not present

## 2022-02-05 DIAGNOSIS — Z125 Encounter for screening for malignant neoplasm of prostate: Secondary | ICD-10-CM | POA: Diagnosis not present

## 2022-02-05 DIAGNOSIS — Z1339 Encounter for screening examination for other mental health and behavioral disorders: Secondary | ICD-10-CM | POA: Diagnosis not present

## 2022-02-05 DIAGNOSIS — Z299 Encounter for prophylactic measures, unspecified: Secondary | ICD-10-CM | POA: Diagnosis not present

## 2022-02-05 DIAGNOSIS — Z1331 Encounter for screening for depression: Secondary | ICD-10-CM | POA: Diagnosis not present

## 2022-02-05 DIAGNOSIS — R5383 Other fatigue: Secondary | ICD-10-CM | POA: Diagnosis not present

## 2022-02-05 DIAGNOSIS — E7800 Pure hypercholesterolemia, unspecified: Secondary | ICD-10-CM | POA: Diagnosis not present

## 2022-02-05 DIAGNOSIS — Z7189 Other specified counseling: Secondary | ICD-10-CM | POA: Diagnosis not present

## 2022-02-05 DIAGNOSIS — Z6824 Body mass index (BMI) 24.0-24.9, adult: Secondary | ICD-10-CM | POA: Diagnosis not present

## 2022-02-05 DIAGNOSIS — Z79899 Other long term (current) drug therapy: Secondary | ICD-10-CM | POA: Diagnosis not present

## 2022-02-12 DIAGNOSIS — E1165 Type 2 diabetes mellitus with hyperglycemia: Secondary | ICD-10-CM | POA: Diagnosis not present

## 2022-03-15 DIAGNOSIS — E1165 Type 2 diabetes mellitus with hyperglycemia: Secondary | ICD-10-CM | POA: Diagnosis not present

## 2022-03-16 DIAGNOSIS — E7849 Other hyperlipidemia: Secondary | ICD-10-CM | POA: Diagnosis not present

## 2022-03-16 DIAGNOSIS — F79 Unspecified intellectual disabilities: Secondary | ICD-10-CM | POA: Diagnosis not present

## 2022-03-24 IMAGING — US US EXTREM LOW VENOUS*R*
1 series · 13 of 24 positions shown · non-contrast
Comparison: None.

CLINICAL DATA: Right lower extremity pain. Recent right total knee
replacement. Evaluate for DVT.



[Series 1: us extrem low venous*right* · 0.07mm/px · 13 of 53 slices shown]
[im 1/53]
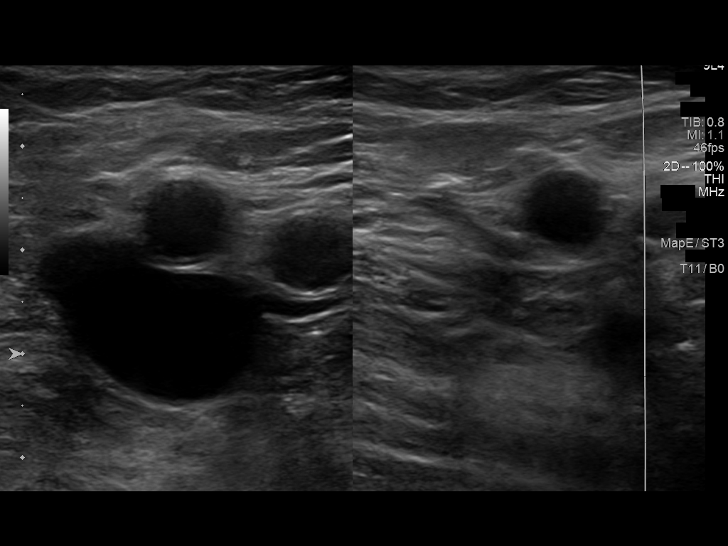
[im 5/53]
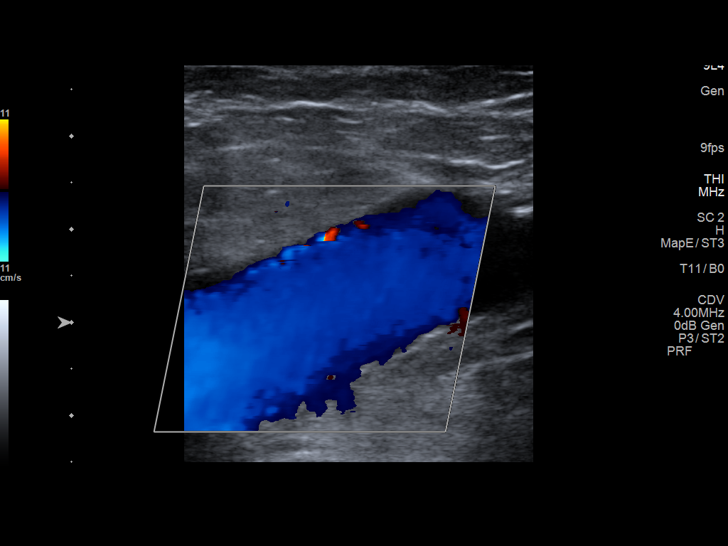
[im 10/53]
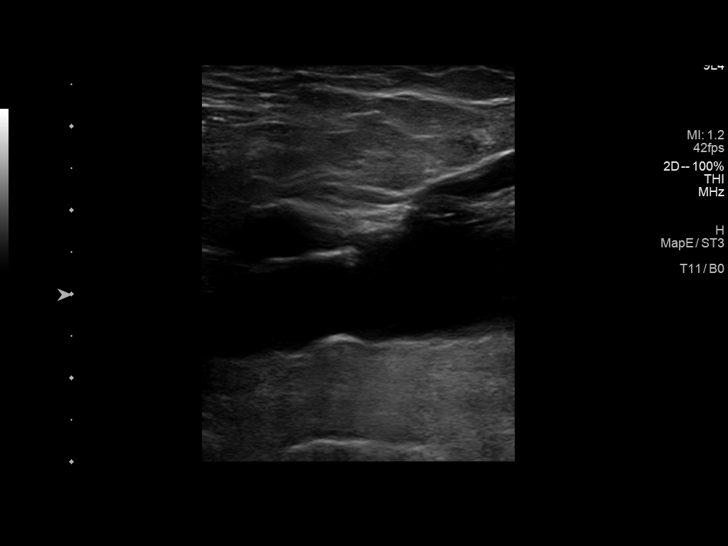
[im 14/53]
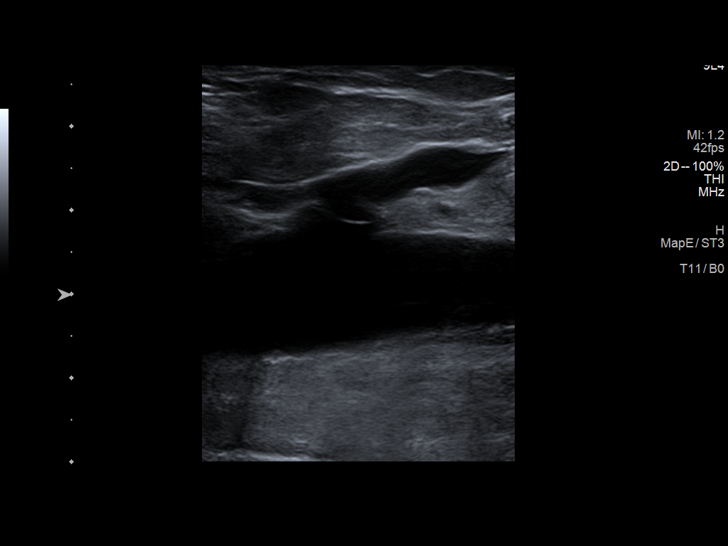
[im 19/53]
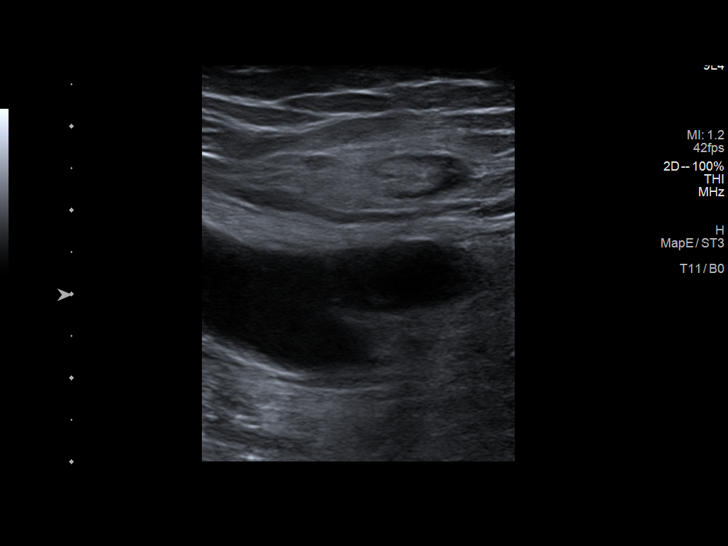
[im 23/53]
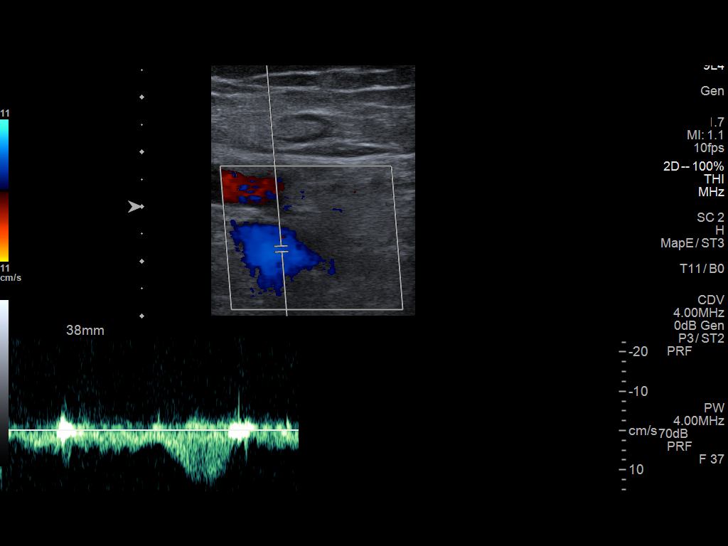
[im 28/53]
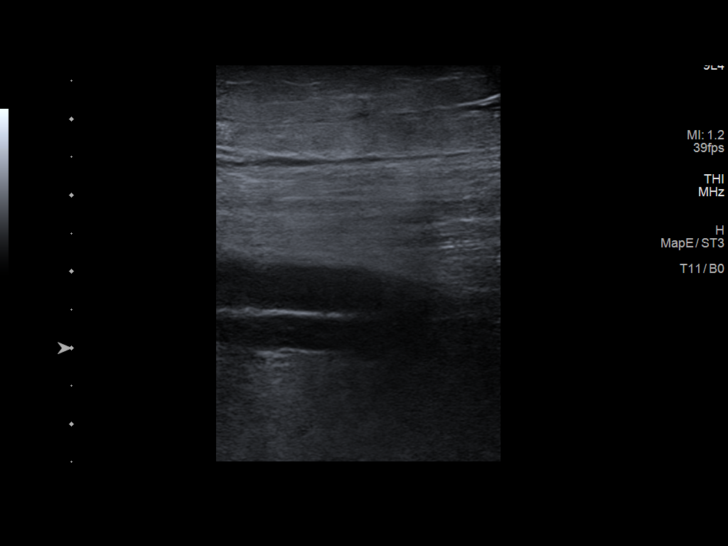
[im 30/53]
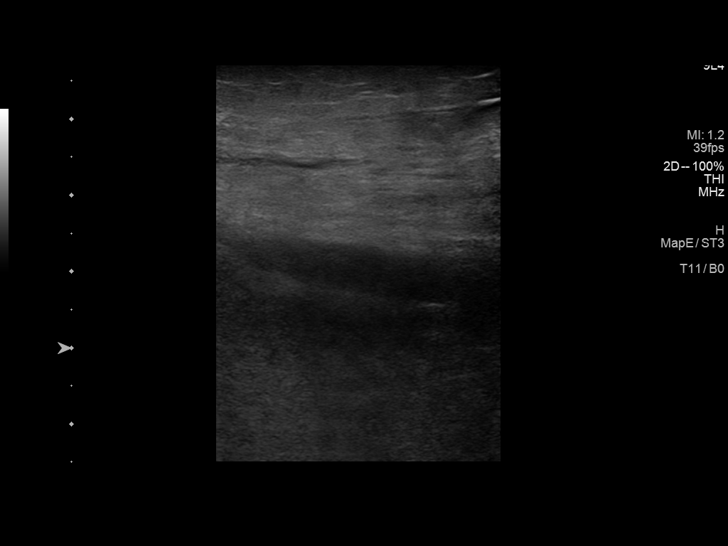
[im 34/53]
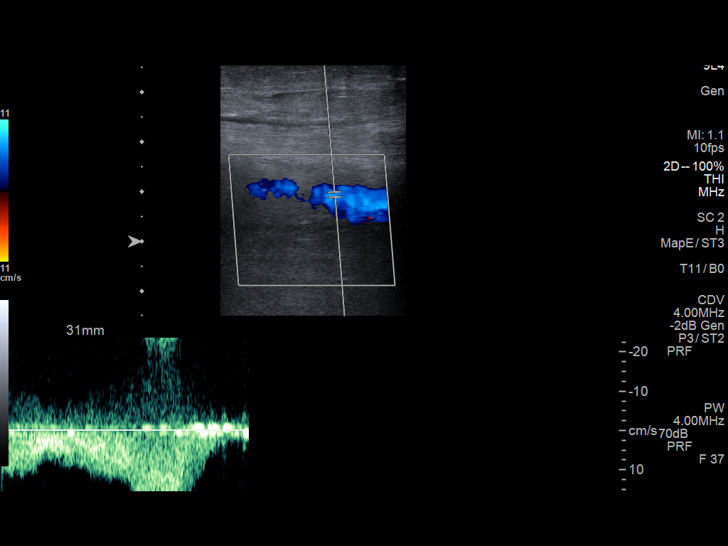
[im 39/53]
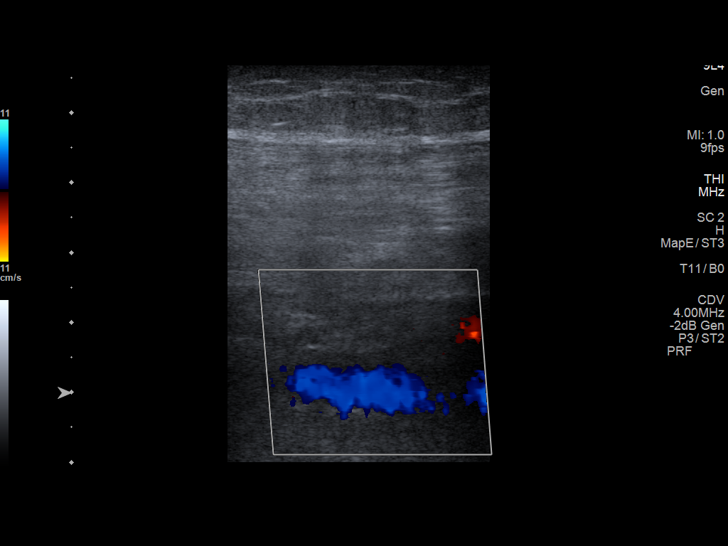
[im 43/53]
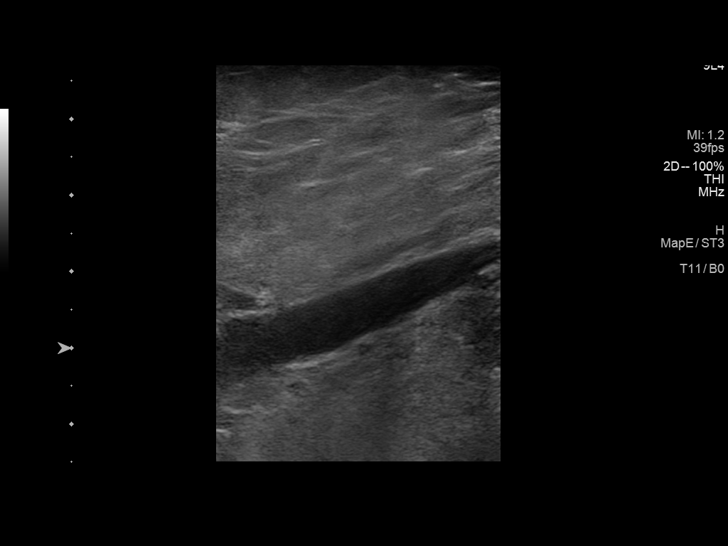
[im 48/53]
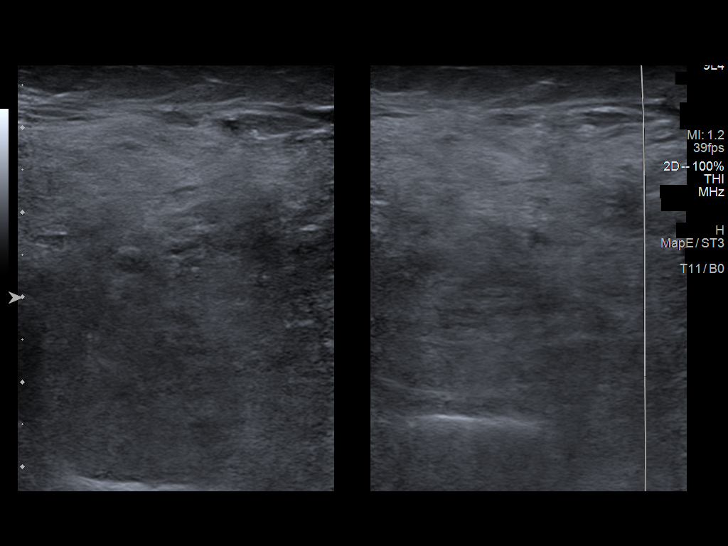
[im 53/53]
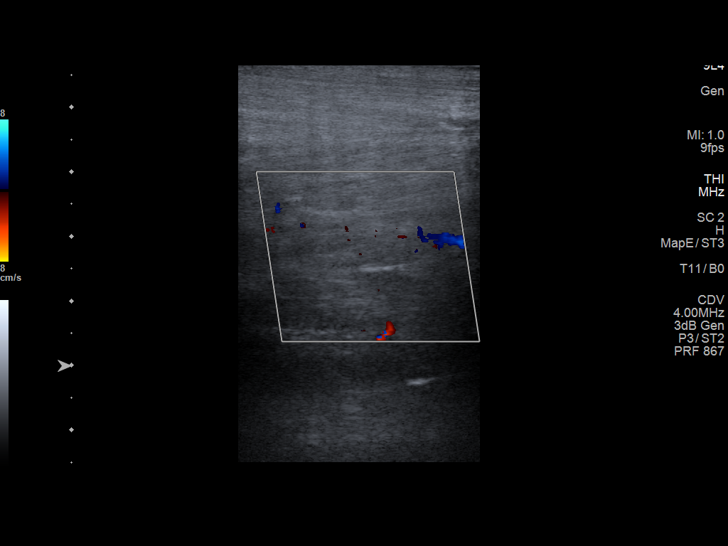

[13 of 24 positions shown; findings below may reference images not displayed]

FINDINGS: Contralateral Common Femoral Vein: Respiratory phasicity is normal
and symmetric with the symptomatic side. No evidence of thrombus.
Normal compressibility.

Common Femoral Vein: No evidence of thrombus. Normal
compressibility, respiratory phasicity and response to augmentation.

Saphenofemoral Junction: No evidence of thrombus. Normal
compressibility and flow on color Doppler imaging.

Profunda Femoral Vein: No evidence of thrombus. Normal
compressibility and flow on color Doppler imaging.

Femoral Vein: No evidence of thrombus. Normal compressibility,
respiratory phasicity and response to augmentation.

Popliteal Vein: No evidence of thrombus. Normal compressibility,
respiratory phasicity and response to augmentation.

Calf Veins: No evidence of thrombus. Normal compressibility and flow
on color Doppler imaging.

Superficial Great Saphenous Vein: No evidence of thrombus. Normal
compressibility.

Venous Reflux:  None.

Other Findings: There is a minimal amount of subcutaneous edema at
the level of the lower leg.
IMPRESSION: No evidence of DVT within the right lower extremity.

## 2022-04-05 DIAGNOSIS — I10 Essential (primary) hypertension: Secondary | ICD-10-CM | POA: Diagnosis not present

## 2022-04-05 DIAGNOSIS — Z299 Encounter for prophylactic measures, unspecified: Secondary | ICD-10-CM | POA: Diagnosis not present

## 2022-04-05 DIAGNOSIS — E1165 Type 2 diabetes mellitus with hyperglycemia: Secondary | ICD-10-CM | POA: Diagnosis not present

## 2022-04-05 DIAGNOSIS — Z713 Dietary counseling and surveillance: Secondary | ICD-10-CM | POA: Diagnosis not present

## 2022-04-05 DIAGNOSIS — Z6824 Body mass index (BMI) 24.0-24.9, adult: Secondary | ICD-10-CM | POA: Diagnosis not present

## 2022-04-14 DIAGNOSIS — E1165 Type 2 diabetes mellitus with hyperglycemia: Secondary | ICD-10-CM | POA: Diagnosis not present

## 2022-05-14 DIAGNOSIS — E1165 Type 2 diabetes mellitus with hyperglycemia: Secondary | ICD-10-CM | POA: Diagnosis not present

## 2022-05-21 DIAGNOSIS — M859 Disorder of bone density and structure, unspecified: Secondary | ICD-10-CM | POA: Diagnosis not present

## 2022-05-21 DIAGNOSIS — M816 Localized osteoporosis [Lequesne]: Secondary | ICD-10-CM | POA: Diagnosis not present

## 2022-05-28 DIAGNOSIS — M816 Localized osteoporosis [Lequesne]: Secondary | ICD-10-CM | POA: Diagnosis not present

## 2022-05-28 DIAGNOSIS — M810 Age-related osteoporosis without current pathological fracture: Secondary | ICD-10-CM | POA: Diagnosis not present

## 2022-06-14 DIAGNOSIS — E1165 Type 2 diabetes mellitus with hyperglycemia: Secondary | ICD-10-CM | POA: Diagnosis not present

## 2022-06-15 DIAGNOSIS — M859 Disorder of bone density and structure, unspecified: Secondary | ICD-10-CM | POA: Diagnosis not present

## 2022-06-15 DIAGNOSIS — M816 Localized osteoporosis [Lequesne]: Secondary | ICD-10-CM | POA: Diagnosis not present

## 2022-07-02 DIAGNOSIS — M859 Disorder of bone density and structure, unspecified: Secondary | ICD-10-CM | POA: Diagnosis not present

## 2022-07-14 DIAGNOSIS — E1165 Type 2 diabetes mellitus with hyperglycemia: Secondary | ICD-10-CM | POA: Diagnosis not present

## 2022-07-15 DIAGNOSIS — I10 Essential (primary) hypertension: Secondary | ICD-10-CM | POA: Diagnosis not present

## 2022-07-15 DIAGNOSIS — Z299 Encounter for prophylactic measures, unspecified: Secondary | ICD-10-CM | POA: Diagnosis not present

## 2022-07-15 DIAGNOSIS — E1165 Type 2 diabetes mellitus with hyperglycemia: Secondary | ICD-10-CM | POA: Diagnosis not present

## 2022-08-10 DIAGNOSIS — H35033 Hypertensive retinopathy, bilateral: Secondary | ICD-10-CM | POA: Diagnosis not present

## 2022-08-14 DIAGNOSIS — E1165 Type 2 diabetes mellitus with hyperglycemia: Secondary | ICD-10-CM | POA: Diagnosis not present

## 2022-09-14 DIAGNOSIS — E1165 Type 2 diabetes mellitus with hyperglycemia: Secondary | ICD-10-CM | POA: Diagnosis not present

## 2022-10-15 DIAGNOSIS — E1165 Type 2 diabetes mellitus with hyperglycemia: Secondary | ICD-10-CM | POA: Diagnosis not present

## 2022-10-18 DIAGNOSIS — Z0000 Encounter for general adult medical examination without abnormal findings: Secondary | ICD-10-CM | POA: Diagnosis not present

## 2022-10-18 DIAGNOSIS — I10 Essential (primary) hypertension: Secondary | ICD-10-CM | POA: Diagnosis not present

## 2022-10-18 DIAGNOSIS — E1129 Type 2 diabetes mellitus with other diabetic kidney complication: Secondary | ICD-10-CM | POA: Diagnosis not present

## 2022-10-18 DIAGNOSIS — Z299 Encounter for prophylactic measures, unspecified: Secondary | ICD-10-CM | POA: Diagnosis not present

## 2022-11-14 DIAGNOSIS — E1165 Type 2 diabetes mellitus with hyperglycemia: Secondary | ICD-10-CM | POA: Diagnosis not present

## 2022-12-15 DIAGNOSIS — E1165 Type 2 diabetes mellitus with hyperglycemia: Secondary | ICD-10-CM | POA: Diagnosis not present

## 2023-01-15 DIAGNOSIS — E1165 Type 2 diabetes mellitus with hyperglycemia: Secondary | ICD-10-CM | POA: Diagnosis not present

## 2023-02-08 DIAGNOSIS — Z299 Encounter for prophylactic measures, unspecified: Secondary | ICD-10-CM | POA: Diagnosis not present

## 2023-02-08 DIAGNOSIS — Z23 Encounter for immunization: Secondary | ICD-10-CM | POA: Diagnosis not present

## 2023-02-08 DIAGNOSIS — Z0000 Encounter for general adult medical examination without abnormal findings: Secondary | ICD-10-CM | POA: Diagnosis not present

## 2023-02-08 DIAGNOSIS — Z1331 Encounter for screening for depression: Secondary | ICD-10-CM | POA: Diagnosis not present

## 2023-02-08 DIAGNOSIS — Z1339 Encounter for screening examination for other mental health and behavioral disorders: Secondary | ICD-10-CM | POA: Diagnosis not present

## 2023-02-08 DIAGNOSIS — Z7189 Other specified counseling: Secondary | ICD-10-CM | POA: Diagnosis not present

## 2023-02-08 DIAGNOSIS — Z125 Encounter for screening for malignant neoplasm of prostate: Secondary | ICD-10-CM | POA: Diagnosis not present

## 2023-02-08 DIAGNOSIS — E7800 Pure hypercholesterolemia, unspecified: Secondary | ICD-10-CM | POA: Diagnosis not present

## 2023-02-08 DIAGNOSIS — I10 Essential (primary) hypertension: Secondary | ICD-10-CM | POA: Diagnosis not present

## 2023-02-08 DIAGNOSIS — Z79899 Other long term (current) drug therapy: Secondary | ICD-10-CM | POA: Diagnosis not present

## 2023-02-08 DIAGNOSIS — E1165 Type 2 diabetes mellitus with hyperglycemia: Secondary | ICD-10-CM | POA: Diagnosis not present

## 2023-02-08 DIAGNOSIS — R5383 Other fatigue: Secondary | ICD-10-CM | POA: Diagnosis not present

## 2023-02-14 DIAGNOSIS — E1165 Type 2 diabetes mellitus with hyperglycemia: Secondary | ICD-10-CM | POA: Diagnosis not present

## 2023-03-16 DIAGNOSIS — E1165 Type 2 diabetes mellitus with hyperglycemia: Secondary | ICD-10-CM | POA: Diagnosis not present

## 2023-04-15 DIAGNOSIS — E1165 Type 2 diabetes mellitus with hyperglycemia: Secondary | ICD-10-CM | POA: Diagnosis not present

## 2023-05-16 DIAGNOSIS — E1165 Type 2 diabetes mellitus with hyperglycemia: Secondary | ICD-10-CM | POA: Diagnosis not present

## 2023-05-23 DIAGNOSIS — I10 Essential (primary) hypertension: Secondary | ICD-10-CM | POA: Diagnosis not present

## 2023-05-23 DIAGNOSIS — E1169 Type 2 diabetes mellitus with other specified complication: Secondary | ICD-10-CM | POA: Diagnosis not present

## 2023-05-23 DIAGNOSIS — Z299 Encounter for prophylactic measures, unspecified: Secondary | ICD-10-CM | POA: Diagnosis not present

## 2023-06-15 DIAGNOSIS — E1165 Type 2 diabetes mellitus with hyperglycemia: Secondary | ICD-10-CM | POA: Diagnosis not present

## 2023-07-04 DIAGNOSIS — Z299 Encounter for prophylactic measures, unspecified: Secondary | ICD-10-CM | POA: Diagnosis not present

## 2023-07-04 DIAGNOSIS — E1169 Type 2 diabetes mellitus with other specified complication: Secondary | ICD-10-CM | POA: Diagnosis not present

## 2023-07-04 DIAGNOSIS — E7800 Pure hypercholesterolemia, unspecified: Secondary | ICD-10-CM | POA: Diagnosis not present

## 2023-07-04 DIAGNOSIS — I10 Essential (primary) hypertension: Secondary | ICD-10-CM | POA: Diagnosis not present

## 2023-07-15 DIAGNOSIS — E1165 Type 2 diabetes mellitus with hyperglycemia: Secondary | ICD-10-CM | POA: Diagnosis not present

## 2023-08-03 DIAGNOSIS — H35033 Hypertensive retinopathy, bilateral: Secondary | ICD-10-CM | POA: Diagnosis not present

## 2023-08-14 DIAGNOSIS — E1165 Type 2 diabetes mellitus with hyperglycemia: Secondary | ICD-10-CM | POA: Diagnosis not present

## 2023-08-22 DIAGNOSIS — I10 Essential (primary) hypertension: Secondary | ICD-10-CM | POA: Diagnosis not present

## 2023-08-22 DIAGNOSIS — Z299 Encounter for prophylactic measures, unspecified: Secondary | ICD-10-CM | POA: Diagnosis not present

## 2023-08-22 DIAGNOSIS — E1169 Type 2 diabetes mellitus with other specified complication: Secondary | ICD-10-CM | POA: Diagnosis not present

## 2023-09-14 DIAGNOSIS — E1165 Type 2 diabetes mellitus with hyperglycemia: Secondary | ICD-10-CM | POA: Diagnosis not present

## 2023-10-15 DIAGNOSIS — E1165 Type 2 diabetes mellitus with hyperglycemia: Secondary | ICD-10-CM | POA: Diagnosis not present

## 2023-11-14 DIAGNOSIS — E1165 Type 2 diabetes mellitus with hyperglycemia: Secondary | ICD-10-CM | POA: Diagnosis not present

## 2023-11-21 DIAGNOSIS — I10 Essential (primary) hypertension: Secondary | ICD-10-CM | POA: Diagnosis not present

## 2023-11-21 DIAGNOSIS — Z0000 Encounter for general adult medical examination without abnormal findings: Secondary | ICD-10-CM | POA: Diagnosis not present

## 2023-11-21 DIAGNOSIS — Z299 Encounter for prophylactic measures, unspecified: Secondary | ICD-10-CM | POA: Diagnosis not present

## 2023-11-21 DIAGNOSIS — R52 Pain, unspecified: Secondary | ICD-10-CM | POA: Diagnosis not present

## 2023-11-21 DIAGNOSIS — Z7189 Other specified counseling: Secondary | ICD-10-CM | POA: Diagnosis not present

## 2023-11-21 DIAGNOSIS — E1165 Type 2 diabetes mellitus with hyperglycemia: Secondary | ICD-10-CM | POA: Diagnosis not present

## 2023-11-21 DIAGNOSIS — Z1331 Encounter for screening for depression: Secondary | ICD-10-CM | POA: Diagnosis not present

## 2023-11-21 DIAGNOSIS — Z1339 Encounter for screening examination for other mental health and behavioral disorders: Secondary | ICD-10-CM | POA: Diagnosis not present

## 2023-11-28 DIAGNOSIS — R55 Syncope and collapse: Secondary | ICD-10-CM | POA: Diagnosis not present

## 2023-12-15 DIAGNOSIS — E1165 Type 2 diabetes mellitus with hyperglycemia: Secondary | ICD-10-CM | POA: Diagnosis not present

## 2023-12-19 DIAGNOSIS — R55 Syncope and collapse: Secondary | ICD-10-CM | POA: Diagnosis not present

## 2024-01-05 DIAGNOSIS — H35032 Hypertensive retinopathy, left eye: Secondary | ICD-10-CM | POA: Diagnosis not present

## 2024-01-07 DIAGNOSIS — R55 Syncope and collapse: Secondary | ICD-10-CM | POA: Diagnosis not present

## 2024-01-14 DIAGNOSIS — E1165 Type 2 diabetes mellitus with hyperglycemia: Secondary | ICD-10-CM | POA: Diagnosis not present

## 2024-01-24 DIAGNOSIS — R55 Syncope and collapse: Secondary | ICD-10-CM | POA: Diagnosis not present

## 2024-02-23 DIAGNOSIS — Z299 Encounter for prophylactic measures, unspecified: Secondary | ICD-10-CM | POA: Diagnosis not present

## 2024-02-23 DIAGNOSIS — Z0000 Encounter for general adult medical examination without abnormal findings: Secondary | ICD-10-CM | POA: Diagnosis not present

## 2024-02-23 DIAGNOSIS — I10 Essential (primary) hypertension: Secondary | ICD-10-CM | POA: Diagnosis not present

## 2024-02-23 DIAGNOSIS — Z23 Encounter for immunization: Secondary | ICD-10-CM | POA: Diagnosis not present

## 2024-03-23 DIAGNOSIS — E1169 Type 2 diabetes mellitus with other specified complication: Secondary | ICD-10-CM | POA: Diagnosis not present
